# Patient Record
Sex: Male | Born: 1985 | Race: Black or African American | Hispanic: No | Smoking: Current every day smoker
Health system: Southern US, Community
[De-identification: ages and names within clinical notes are randomized; demographics above are authoritative.]

## PROBLEM LIST (undated history)

## (undated) ENCOUNTER — Ambulatory Visit: Source: Home / Self Care

## (undated) DIAGNOSIS — Z789 Other specified health status: Secondary | ICD-10-CM

## (undated) HISTORY — PX: NO PAST SURGERIES: SHX2092

---

## 2006-06-18 ENCOUNTER — Emergency Department: Payer: Self-pay | Admitting: Emergency Medicine

## 2006-11-04 ENCOUNTER — Emergency Department: Payer: Self-pay | Admitting: Emergency Medicine

## 2008-09-30 ENCOUNTER — Emergency Department: Payer: Self-pay

## 2011-11-13 ENCOUNTER — Emergency Department: Payer: Self-pay | Admitting: Unknown Physician Specialty

## 2016-02-16 DIAGNOSIS — S62308A Unspecified fracture of other metacarpal bone, initial encounter for closed fracture: Secondary | ICD-10-CM

## 2016-02-16 HISTORY — DX: Unspecified fracture of other metacarpal bone, initial encounter for closed fracture: S62.308A

## 2016-03-09 ENCOUNTER — Emergency Department
Admission: EM | Admit: 2016-03-09 | Discharge: 2016-03-09 | Disposition: A | Discharge status: Home / Self Care | Payer: BLUE CROSS/BLUE SHIELD | Attending: Emergency Medicine | Admitting: Emergency Medicine

## 2016-03-09 ENCOUNTER — Encounter: Payer: Self-pay | Admitting: *Deleted

## 2016-03-09 ENCOUNTER — Emergency Department: Payer: BLUE CROSS/BLUE SHIELD

## 2016-03-09 DIAGNOSIS — F17210 Nicotine dependence, cigarettes, uncomplicated: Secondary | ICD-10-CM | POA: Diagnosis not present

## 2016-03-09 DIAGNOSIS — Y9389 Activity, other specified: Secondary | ICD-10-CM | POA: Diagnosis not present

## 2016-03-09 DIAGNOSIS — Y9289 Other specified places as the place of occurrence of the external cause: Secondary | ICD-10-CM | POA: Insufficient documentation

## 2016-03-09 DIAGNOSIS — W208XXA Other cause of strike by thrown, projected or falling object, initial encounter: Secondary | ICD-10-CM | POA: Diagnosis not present

## 2016-03-09 DIAGNOSIS — S62309A Unspecified fracture of unspecified metacarpal bone, initial encounter for closed fracture: Secondary | ICD-10-CM

## 2016-03-09 DIAGNOSIS — S6991XA Unspecified injury of right wrist, hand and finger(s), initial encounter: Secondary | ICD-10-CM | POA: Diagnosis present

## 2016-03-09 DIAGNOSIS — Y999 Unspecified external cause status: Secondary | ICD-10-CM | POA: Insufficient documentation

## 2016-03-09 DIAGNOSIS — S62394A Other fracture of fourth metacarpal bone, right hand, initial encounter for closed fracture: Secondary | ICD-10-CM | POA: Insufficient documentation

## 2016-03-09 MED ORDER — OXYCODONE-ACETAMINOPHEN 5-325 MG PO TABS
2.0000 | ORAL_TABLET | Freq: Once | ORAL | Status: AC
  Administered 2016-03-09: 2 via ORAL
  Filled 2016-03-09: qty 2

## 2016-03-09 MED ORDER — OXYCODONE-ACETAMINOPHEN 7.5-325 MG PO TABS: 1.0000 | ORAL_TABLET | ORAL | 0 refills | Status: DC | PRN

## 2016-03-09 MED ORDER — IBUPROFEN 600 MG PO TABS: 600.0000 mg | ORAL_TABLET | Freq: Three times a day (TID) | ORAL | 0 refills | Status: DC | PRN

## 2016-03-09 NOTE — Discharge Instructions (Signed)
Wears splint and sling until evaluation by orthopedic Dr.

## 2016-03-09 NOTE — ED Triage Notes (Signed)
Pt was working on a motor, motor fell on right hand, right hand swollen and painful

## 2016-03-09 NOTE — ED Provider Notes (Signed)
[facility] Emergency Department Provider Note   ____________________________________________   First MD Initiated Contact with Patient 03/09/16 1516     (approximate)  I have reviewed the triage vital signs and the nursing notes.   HISTORY  Chief Complaint Hand Injury    HPI Cody Mccarty is a 30 y.o. male patient complaining of pain and edema to the right hand secondary to blunt trauma. Patient say a call motor fell on his hand while he was working. Patient denies loss of sensation but state increased pain with flexion of his fingers. Patient is left-hand dominant. Patient is rating his pain as a 10 over 10. Patient described a pain as sharp.   History reviewed. No pertinent past medical history.  There are no active problems to display for this patient.   History reviewed. No pertinent surgical history.  Prior to Admission medications   Medication Sig Start Date End Date Taking? Authorizing Provider  ibuprofen (ADVIL,MOTRIN) 600 MG tablet Take 1 tablet (600 mg total) by mouth every 8 (eight) hours as needed. 03/09/16   Joni Reiningonald K Dyasia Firestine, PA-C  oxyCODONE-acetaminophen (PERCOCET) 7.5-325 MG tablet Take 1 tablet by mouth every 4 (four) hours as needed for severe pain. 03/09/16   Joni Reiningonald K Rhilynn Preyer, PA-C    Allergies Review of patient's allergies indicates no known allergies.  No family history on file.  Social History Social History  Substance Use Topics  . Smoking status: Current Every Day Smoker    Packs/day: 0.50    Types: Cigarettes  . Smokeless tobacco: Not on file  . Alcohol use No    Review of Systems Constitutional: No fever/chills Eyes: No visual changes. ENT: No sore throat. Cardiovascular: Denies chest pain. Respiratory: Denies shortness of breath. Gastrointestinal: No abdominal pain.  No nausea, no vomiting.  No diarrhea.  No constipation. Genitourinary: Negative for dysuria. Musculoskeletal: Right hand pain  Skin: Negative  for rash. Neurological: Negative for headaches, focal weakness or numbness.    ____________________________________________   PHYSICAL EXAM:  VITAL SIGNS: ED Triage Vitals  Enc Vitals Group     BP 03/09/16 1406 128/78     Pulse Rate 03/09/16 1406 78     Resp 03/09/16 1406 18     Temp 03/09/16 1406 98 F (36.7 C)     Temp Source 03/09/16 1406 Oral     SpO2 03/09/16 1406 97 %     Weight 03/09/16 1407 160 lb (72.6 kg)     Height 03/09/16 1407 5\' 10"  (1.778 m)     Head Circumference --      Peak Flow --      Pain Score 03/09/16 1407 10     Pain Loc --      Pain Edu? --      Excl. in GC? --     Constitutional: Alert and oriented. Well appearing and in no acute distress. Eyes: Conjunctivae are normal. PERRL. EOMI. Head: Atraumatic. Nose: No congestion/rhinnorhea. Mouth/Throat: Mucous membranes are moist.  Oropharynx non-erythematous. Neck: No stridor.  No cervical spine tenderness to palpation Hematological/Lymphatic/Immunilogical: No cervical lymphadenopathy. Cardiovascular: Normal rate, regular rhythm. Grossly normal heart sounds.  Good peripheral circulation. Respiratory: Normal respiratory effort.  No retractions. Lungs CTAB. Gastrointestinal: Soft and nontender. No distention. No abdominal bruits. No CVA tenderness. Musculoskeletal: No lower extremity tenderness nor edema.  No joint effusions. Neurologic:  Normal speech and language. No gross focal neurologic deficits are appreciated. No gait instability. Skin:  Skin is warm, dry and intact. No rash  noted. Psychiatric: Mood and affect are normal. Speech and behavior are normal.  ____________________________________________   LABS (all labs ordered are listed, but only abnormal results are displayed)  Labs Reviewed - No data to display ____________________________________________  EKG   ____________________________________________  RADIOLOGY  X-rays reveal a Exelon the proximal aspect of the fourth metacarpal  with a dislocation at the hamate and fourth metacarpal junction. ____________________________________________   PROCEDURES  Procedure(s) performed: None  Procedures  Critical Care performed: No  ____________________________________________   INITIAL IMPRESSION / ASSESSMENT AND PLAN / ED COURSE  Pertinent labs & imaging results that were available during my care of the patient were reviewed by me and considered in my medical decision making (see chart for details).  Right hand fracture. Discussed x-ray finding with patient. Patient placed in a volar splint and sling. Patient given a prescription for Percocets and ibuprofen. Patient advised to contact the orthopedic department in 2 days to schedule follow-up appointment.  Clinical Course     ____________________________________________   FINAL CLINICAL IMPRESSION(S) / ED DIAGNOSES  Final diagnoses:  Metacarpal bone fracture, closed, initial encounter      NEW MEDICATIONS STARTED DURING THIS VISIT:  New Prescriptions   IBUPROFEN (ADVIL,MOTRIN) 600 MG TABLET    Take 1 tablet (600 mg total) by mouth every 8 (eight) hours as needed.   OXYCODONE-ACETAMINOPHEN (PERCOCET) 7.5-325 MG TABLET    Take 1 tablet by mouth every 4 (four) hours as needed for severe pain.     Note:  This document was prepared using Dragon voice recognition software and may include unintentional dictation errors.    Joni Reining, PA-C 03/09/16 1612    Arnaldo Natal, MD 03/09/16 [phone]

## 2016-03-13 ENCOUNTER — Encounter
Admission: RE | Admit: 2016-03-13 | Discharge: 2016-03-13 | Disposition: A | Discharge status: Home / Self Care | Payer: BLUE CROSS/BLUE SHIELD | Source: Ambulatory Visit | Attending: Orthopedic Surgery | Admitting: Orthopedic Surgery

## 2016-03-13 HISTORY — DX: Other specified health status: Z78.9

## 2016-03-14 ENCOUNTER — Encounter: Payer: Self-pay | Admitting: *Deleted

## 2016-03-14 ENCOUNTER — Ambulatory Visit
Admission: RE | Admit: 2016-03-14 | Discharge: 2016-03-14 | Disposition: A | Discharge status: Home / Self Care | Payer: BLUE CROSS/BLUE SHIELD | Source: Ambulatory Visit | Attending: Orthopedic Surgery | Admitting: Orthopedic Surgery

## 2016-03-14 ENCOUNTER — Ambulatory Visit: Payer: BLUE CROSS/BLUE SHIELD | Admitting: Anesthesiology

## 2016-03-14 ENCOUNTER — Ambulatory Visit: Payer: BLUE CROSS/BLUE SHIELD

## 2016-03-14 ENCOUNTER — Encounter
Admission: RE | Disposition: A | Discharge status: Home / Self Care | Payer: Self-pay | Source: Ambulatory Visit | Attending: Orthopedic Surgery

## 2016-03-14 DIAGNOSIS — W208XXA Other cause of strike by thrown, projected or falling object, initial encounter: Secondary | ICD-10-CM | POA: Insufficient documentation

## 2016-03-14 DIAGNOSIS — F17200 Nicotine dependence, unspecified, uncomplicated: Secondary | ICD-10-CM | POA: Diagnosis not present

## 2016-03-14 DIAGNOSIS — G8918 Other acute postprocedural pain: Secondary | ICD-10-CM

## 2016-03-14 DIAGNOSIS — S62314A Displaced fracture of base of fourth metacarpal bone, right hand, initial encounter for closed fracture: Secondary | ICD-10-CM | POA: Diagnosis present

## 2016-03-14 DIAGNOSIS — Y9389 Activity, other specified: Secondary | ICD-10-CM | POA: Insufficient documentation

## 2016-03-14 HISTORY — PX: CLOSED REDUCTION FINGER WITH PERCUTANEOUS PINNING: SHX5612

## 2016-03-14 HISTORY — PX: OPEN REDUCTION INTERNAL FIXATION (ORIF) METACARPAL: SHX6234

## 2016-03-14 SURGERY — CLOSED REDUCTION, FINGER, WITH PERCUTANEOUS PINNING
Anesthesia: General | Laterality: Right

## 2016-03-14 MED ORDER — FAMOTIDINE 20 MG PO TABS
20.0000 mg | ORAL_TABLET | Freq: Once | ORAL | Status: AC
  Administered 2016-03-14: 20 mg via ORAL

## 2016-03-14 MED ORDER — FAMOTIDINE 20 MG PO TABS
ORAL_TABLET | ORAL | Status: AC
  Administered 2016-03-14: 20 mg via ORAL
  Filled 2016-03-14: qty 1

## 2016-03-14 MED ORDER — FENTANYL CITRATE (PF) 100 MCG/2ML IJ SOLN
25.0000 ug | INTRAMUSCULAR | Status: AC | PRN
  Administered 2016-03-14 (×6): 25 ug via INTRAVENOUS

## 2016-03-14 MED ORDER — METOCLOPRAMIDE HCL 10 MG PO TABS: 5.0000 mg | ORAL_TABLET | Freq: Three times a day (TID) | ORAL | Status: DC | PRN

## 2016-03-14 MED ORDER — DEXAMETHASONE SODIUM PHOSPHATE 10 MG/ML IJ SOLN
INTRAMUSCULAR | Status: DC | PRN
  Administered 2016-03-14: 10 mg via INTRAVENOUS

## 2016-03-14 MED ORDER — FENTANYL CITRATE (PF) 100 MCG/2ML IJ SOLN
INTRAMUSCULAR | Status: AC
  Administered 2016-03-14: 25 ug via INTRAVENOUS
  Filled 2016-03-14: qty 2

## 2016-03-14 MED ORDER — FENTANYL CITRATE (PF) 100 MCG/2ML IJ SOLN
INTRAMUSCULAR | Status: AC
  Filled 2016-03-14: qty 2

## 2016-03-14 MED ORDER — ONDANSETRON HCL 4 MG/2ML IJ SOLN
INTRAMUSCULAR | Status: DC | PRN
  Administered 2016-03-14: 4 mg via INTRAVENOUS

## 2016-03-14 MED ORDER — OXYCODONE-ACETAMINOPHEN 5-325 MG PO TABS
1.0000 | ORAL_TABLET | ORAL | Status: DC | PRN
  Administered 2016-03-14: 1 via ORAL

## 2016-03-14 MED ORDER — OXYCODONE-ACETAMINOPHEN 7.5-325 MG PO TABS: 1.0000 | ORAL_TABLET | ORAL | 0 refills | Status: DC | PRN

## 2016-03-14 MED ORDER — ONDANSETRON HCL 4 MG PO TABS: 4.0000 mg | ORAL_TABLET | Freq: Four times a day (QID) | ORAL | Status: DC | PRN

## 2016-03-14 MED ORDER — FENTANYL CITRATE (PF) 100 MCG/2ML IJ SOLN
INTRAMUSCULAR | Status: DC | PRN
  Administered 2016-03-14 (×2): 50 ug via INTRAVENOUS

## 2016-03-14 MED ORDER — CEFAZOLIN SODIUM-DEXTROSE 2-4 GM/100ML-% IV SOLN
INTRAVENOUS | Status: AC
  Filled 2016-03-14: qty 100

## 2016-03-14 MED ORDER — OXYCODONE-ACETAMINOPHEN 5-325 MG PO TABS
ORAL_TABLET | ORAL | Status: AC
  Administered 2016-03-14: 1 via ORAL
  Filled 2016-03-14: qty 1

## 2016-03-14 MED ORDER — CEFAZOLIN SODIUM-DEXTROSE 2-4 GM/100ML-% IV SOLN
2.0000 g | Freq: Once | INTRAVENOUS | Status: AC
  Administered 2016-03-14: 2 g via INTRAVENOUS

## 2016-03-14 MED ORDER — LACTATED RINGERS IV SOLN
INTRAVENOUS | Status: DC
  Administered 2016-03-14: 12:00:00 via INTRAVENOUS

## 2016-03-14 MED ORDER — ONDANSETRON HCL 4 MG/2ML IJ SOLN: 4.0000 mg | Freq: Once | INTRAMUSCULAR | Status: DC | PRN

## 2016-03-14 MED ORDER — ONDANSETRON HCL 4 MG/2ML IJ SOLN: 4.0000 mg | Freq: Four times a day (QID) | INTRAMUSCULAR | Status: DC | PRN

## 2016-03-14 MED ORDER — SODIUM CHLORIDE 0.9 % IV SOLN: INTRAVENOUS | Status: DC

## 2016-03-14 MED ORDER — METOCLOPRAMIDE HCL 5 MG/ML IJ SOLN: 5.0000 mg | Freq: Three times a day (TID) | INTRAMUSCULAR | Status: DC | PRN

## 2016-03-14 SURGICAL SUPPLY — 41 items
BANDAGE ELASTIC 2 LF NS (GAUZE/BANDAGES/DRESSINGS) ×3 IMPLANT
BANDAGE ELASTIC 4 LF NS (GAUZE/BANDAGES/DRESSINGS) ×3 IMPLANT
BNDG COHESIVE 1X5 TAN NS LF (GAUZE/BANDAGES/DRESSINGS) ×3 IMPLANT
BNDG COHESIVE 4X5 TAN STRL (GAUZE/BANDAGES/DRESSINGS) IMPLANT
BNDG ESMARK 4X12 TAN STRL LF (GAUZE/BANDAGES/DRESSINGS) IMPLANT
BNDG GAUZE 1X2.1 STRL (MISCELLANEOUS) ×3 IMPLANT
CAST PADDING 2X4YD ST 30245 (MISCELLANEOUS) ×2
CHLORAPREP W/TINT 26ML (MISCELLANEOUS) ×3 IMPLANT
CUFF TOURN 18 STER (MISCELLANEOUS) IMPLANT
DRAPE FLUOR MINI C-ARM 54X84 (DRAPES) ×3 IMPLANT
ELECT CAUTERY BLADE 6.4 (BLADE) ×3 IMPLANT
ELECT REM PT RETURN 9FT ADLT (ELECTROSURGICAL)
ELECTRODE REM PT RTRN 9FT ADLT (ELECTROSURGICAL) IMPLANT
GAUZE PETRO XEROFOAM 1X8 (MISCELLANEOUS) ×3 IMPLANT
GAUZE SPONGE 4X4 12PLY STRL (GAUZE/BANDAGES/DRESSINGS) ×3 IMPLANT
GAUZE SPONGE NON-WVN 2X2 STRL (MISCELLANEOUS) ×1 IMPLANT
GLOVE BIOGEL PI IND STRL 9 (GLOVE) ×1 IMPLANT
GLOVE BIOGEL PI INDICATOR 9 (GLOVE) ×2
GLOVE SURG SYN 9.0  PF PI (GLOVE) ×2
GLOVE SURG SYN 9.0 PF PI (GLOVE) ×1 IMPLANT
GOWN SRG 2XL LVL 4 RGLN SLV (GOWNS) ×1 IMPLANT
GOWN STRL NON-REIN 2XL LVL4 (GOWNS) ×2
GOWN STRL REUS W/ TWL LRG LVL3 (GOWN DISPOSABLE) ×1 IMPLANT
GOWN STRL REUS W/TWL LRG LVL3 (GOWN DISPOSABLE) ×2
K-WIRE DBL TROCAR .045X4 ×3 IMPLANT
KIT RM TURNOVER STRD PROC AR (KITS) ×3 IMPLANT
KWIRE DBL TROCAR .045X4 ×1 IMPLANT
NEEDLE FILTER BLUNT 18X 1/2SAF (NEEDLE) ×2
NEEDLE FILTER BLUNT 18X1 1/2 (NEEDLE) ×1 IMPLANT
NEEDLE HYPO 25X1 1.5 SAFETY (NEEDLE) IMPLANT
NS IRRIG 500ML POUR BTL (IV SOLUTION) IMPLANT
PACK EXTREMITY ARMC (MISCELLANEOUS) ×3 IMPLANT
PAD PREP 24X41 OB/GYN DISP (PERSONAL CARE ITEMS) IMPLANT
PADDING CAST BLEND 4X4 NS (MISCELLANEOUS) ×3 IMPLANT
PADDING CAST COTTON 2X4 ST (MISCELLANEOUS) ×1 IMPLANT
SPONGE VERSALON 2X2 STRL (MISCELLANEOUS) ×2
STOCKINETTE STRL 4IN 9604848 (GAUZE/BANDAGES/DRESSINGS) ×3 IMPLANT
SUT ETHIBOND 4-0 (SUTURE) IMPLANT
SUT ETHILON 5 0 CL P 3 (SUTURE) IMPLANT
SUT ETHILON 5-0 FS-2 18 BLK (SUTURE) IMPLANT
SYRINGE 10CC LL (SYRINGE) IMPLANT

## 2016-03-14 NOTE — H&P (Signed)
Reviewed paper H+P, will be scanned into chart. No changes noted.  

## 2016-03-14 NOTE — Anesthesia Preprocedure Evaluation (Signed)
Anesthesia Evaluation  Patient identified by MRN, date of birth, ID band Patient awake    Reviewed: Allergy & Precautions, NPO status , Patient's Chart, lab work & pertinent test results  Airway Mallampati: III       Dental   Pulmonary Current Smoker,           Cardiovascular negative cardio ROS       Neuro/Psych negative neurological ROS     GI/Hepatic negative GI ROS, Neg liver ROS,   Endo/Other  negative endocrine ROS  Renal/GU negative Renal ROS  negative genitourinary   Musculoskeletal negative musculoskeletal ROS (+)   Abdominal   Peds negative pediatric ROS (+)  Hematology negative hematology ROS (+)   Anesthesia Other Findings   Reproductive/Obstetrics                             Anesthesia Physical Anesthesia Plan  ASA: I  Anesthesia Plan: General   Post-op Pain Management:    Induction: Intravenous  Airway Management Planned: LMA  Additional Equipment:   Intra-op Plan:   Post-operative Plan:   Informed Consent: I have reviewed the patients History and Physical, chart, labs and discussed the procedure including the risks, benefits and alternatives for the proposed anesthesia with the patient or authorized representative who has indicated his/her understanding and acceptance.     Plan Discussed with:   Anesthesia Plan Comments:         Anesthesia Quick Evaluation

## 2016-03-14 NOTE — Transfer of Care (Signed)
Immediate Anesthesia Transfer of Care Note  Patient: Cody Mccarty  Procedure(s) Performed: Procedure(s): CLOSED REDUCTION FINGER WITH PERCUTANEOUS PINNING (Right) OPEN REDUCTION INTERNAL FIXATION (ORIF) METACARPAL (Right)  Patient Location: PACU  Anesthesia Type:General  Level of Consciousness: patient cooperative and lethargic  Airway & Oxygen Therapy: Patient Spontanous Breathing and Patient connected to face mask oxygen  Post-op Assessment: Report given to RN and Post -op Vital signs reviewed and stable  Post vital signs: Reviewed and stable  Last Vitals:  Vitals:   03/14/16 1055 03/14/16 1248  BP: 130/79 128/71  Pulse: 72 68  Resp: 16 (!) 22  Temp: 36.9 C 36.5 C    Last Pain:  Vitals:   03/14/16 1248  TempSrc: Temporal         Complications: No apparent anesthesia complications

## 2016-03-14 NOTE — Discharge Instructions (Signed)
AMBULATORY SURGERY  DISCHARGE INSTRUCTIONS   1) The drugs that you were given will stay in your system until tomorrow so for the next 24 hours you should not:  A) Drive an automobile B) Make any legal decisions C) Drink any alcoholic beverage   2) You may resume regular meals tomorrow.  Today it is better to start with liquids and gradually work up to solid foods.  You may eat anything you prefer, but it is better to start with liquids, then soup and crackers, and gradually work up to solid foods.   3) Please notify your doctor immediately if you have any unusual bleeding, trouble breathing, redness and pain at the surgery site, drainage, fever, or pain not relieved by medication. 4)   5) Your post-operative visit with Dr.                                     is: Date:                        Time:    Please call to schedule your post-operative visit.  6) Additional Instructions:   Keep arm elevated. Work fingers as tolerated. Keep splint dry and intact

## 2016-03-14 NOTE — Anesthesia Postprocedure Evaluation (Signed)
Anesthesia Post Note  Patient: Cody Mccarty  Procedure(s) Performed: Procedure(s) (LRB): CLOSED REDUCTION FINGER WITH PERCUTANEOUS PINNING (Right) OPEN REDUCTION INTERNAL FIXATION (ORIF) METACARPAL (Right)  Patient location during evaluation: PACU Anesthesia Type: General Level of consciousness: awake and alert Pain management: pain level controlled Vital Signs Assessment: post-procedure vital signs reviewed and stable Respiratory status: spontaneous breathing and respiratory function stable Cardiovascular status: stable Anesthetic complications: no    Last Vitals:  Vitals:   03/14/16 1329 03/14/16 1357  BP: 125/79 123/79  Pulse: 69 (!) 56  Resp: 18 16  Temp:      Last Pain:  Vitals:   03/14/16 1357  TempSrc:   PainSc: 3                  Alesha Jaffee K

## 2016-03-14 NOTE — Anesthesia Procedure Notes (Signed)
Procedure Name: LMA Insertion Date/Time: 03/14/2016 12:24 PM Performed by: Omer JackWEATHERLY, Cody Krohn Pre-anesthesia Checklist: Patient identified, Patient being monitored, Timeout performed, Emergency Drugs available and Suction available Patient Re-evaluated:Patient Re-evaluated prior to inductionOxygen Delivery Method: Circle system utilized Preoxygenation: Pre-oxygenation with 100% oxygen Intubation Type: IV induction Ventilation: Mask ventilation without difficulty LMA: LMA inserted LMA Size: 4.0 Tube type: Oral Number of attempts: 1 Placement Confirmation: positive ETCO2 and breath sounds checked- equal and bilateral Tube secured with: Tape Dental Injury: Teeth and Oropharynx as per pre-operative assessment

## 2016-03-14 NOTE — Op Note (Signed)
03/14/2016  12:47 PM  PATIENT:  Cody Mccarty  30 y.o. male  PRE-OPERATIVE DIAGNOSIS:  closed displaced 4th carpal metacarpal joint fracture dislocation right hand  POST-OPERATIVE DIAGNOSIS:  Same  PROCEDURE:  Close reduction pinning of fourth CMC fracture dislocation  SURGEON: Leitha SchullerMichael J Savahanna Almendariz, MD  ASSISTANTS: None  ANESTHESIA:   general  EBL:  No intake/output data recorded.  BLOOD ADMINISTERED:none  DRAINS: none   LOCAL MEDICATIONS USED:  None    SPECIMEN:  No Specimen  DISPOSITION OF SPECIMEN:  N/A  COUNTS:  YES  TOURNIQUET:    IMPLANTS: 0.45 K wire 1  DICTATION: .Dragon Dictation patient brought the operating room and after adequate general anesthesia was obtained the right arm was prepped and draped in sterile fashion. Appropriate patient education timeout procedure were completed. After bringing in the mini C-arm reduction was carried out by dorsal pressure to the base of the fourth metacarpal with palpable and visible reduction. C-arm showed anatomic reduction. A 45 K wires then sent through the base of the fourth metacarpal across to the capitate without entering the carpal tunnel. This pin was then cut short and bent over to prevent migration. Xeroform around the base of the pin 4 x 4's and a short arm splint were applied followed by an Ace wrap patient tolerated procedure well center comes stable condition  PLAN OF CARE: Discharge to home after PACU  PATIENT DISPOSITION:  PACU - hemodynamically stable.

## 2017-06-09 ENCOUNTER — Emergency Department
Admission: EM | Admit: 2017-06-09 | Discharge: 2017-06-09 | Disposition: A | Discharge status: Home / Self Care | Payer: BLUE CROSS/BLUE SHIELD | Attending: Emergency Medicine | Admitting: Emergency Medicine

## 2017-06-09 ENCOUNTER — Other Ambulatory Visit: Payer: Self-pay

## 2017-06-09 ENCOUNTER — Emergency Department: Payer: BLUE CROSS/BLUE SHIELD

## 2017-06-09 ENCOUNTER — Encounter: Payer: Self-pay | Admitting: Emergency Medicine

## 2017-06-09 DIAGNOSIS — R079 Chest pain, unspecified: Secondary | ICD-10-CM | POA: Diagnosis not present

## 2017-06-09 DIAGNOSIS — J069 Acute upper respiratory infection, unspecified: Secondary | ICD-10-CM

## 2017-06-09 DIAGNOSIS — B349 Viral infection, unspecified: Secondary | ICD-10-CM | POA: Insufficient documentation

## 2017-06-09 DIAGNOSIS — F17210 Nicotine dependence, cigarettes, uncomplicated: Secondary | ICD-10-CM | POA: Insufficient documentation

## 2017-06-09 DIAGNOSIS — B9789 Other viral agents as the cause of diseases classified elsewhere: Secondary | ICD-10-CM

## 2017-06-09 DIAGNOSIS — R05 Cough: Secondary | ICD-10-CM | POA: Diagnosis present

## 2017-06-09 LAB — BASIC METABOLIC PANEL
Anion gap: 6 (ref 5–15)
BUN: 16 mg/dL (ref 6–20)
CALCIUM: 8.8 mg/dL — AB (ref 8.9–10.3)
CO2: 26 mmol/L (ref 22–32)
Chloride: 109 mmol/L (ref 101–111)
Creatinine, Ser: 1.08 mg/dL (ref 0.61–1.24)
GFR calc Af Amer: >60 mL/min (ref >60)
GLUCOSE: 96 mg/dL (ref 65–99)
POTASSIUM: 3.9 mmol/L (ref 3.5–5.1)
SODIUM: 141 mmol/L (ref 135–145)

## 2017-06-09 LAB — CBC
HEMATOCRIT: 37.7 % — AB (ref 40.0–52.0)
Hemoglobin: 12.4 g/dL — ABNORMAL LOW (ref 13.0–18.0)
MCH: 29.2 pg (ref 26.0–34.0)
MCHC: 33 g/dL (ref 32.0–36.0)
MCV: 88.6 fL (ref 80.0–100.0)
Platelets: 231 10*3/uL (ref 150–440)
RBC: 4.25 MIL/uL — ABNORMAL LOW (ref 4.40–5.90)
RDW: 13.1 % (ref 11.5–14.5)
WBC: 8.2 10*3/uL (ref 3.8–10.6)

## 2017-06-09 LAB — TROPONIN I

## 2017-06-09 MED ORDER — PREDNISONE 20 MG PO TABS
60.0000 mg | ORAL_TABLET | Freq: Once | ORAL | Status: AC
  Administered 2017-06-09: 60 mg via ORAL
  Filled 2017-06-09: qty 3

## 2017-06-09 MED ORDER — ACETAMINOPHEN 500 MG PO TABS
1000.0000 mg | ORAL_TABLET | Freq: Once | ORAL | Status: AC
  Administered 2017-06-09: 1000 mg via ORAL
  Filled 2017-06-09: qty 2

## 2017-06-09 MED ORDER — PREDNISONE 20 MG PO TABS: 60.0000 mg | ORAL_TABLET | Freq: Every day | ORAL | 0 refills | Status: AC

## 2017-06-09 MED ORDER — ALBUTEROL SULFATE (2.5 MG/3ML) 0.083% IN NEBU
5.0000 mg | INHALATION_SOLUTION | Freq: Once | RESPIRATORY_TRACT | Status: AC
  Administered 2017-06-09: 5 mg via RESPIRATORY_TRACT
  Filled 2017-06-09: qty 6

## 2017-06-09 MED ORDER — ASPIRIN 81 MG PO CHEW
324.0000 mg | CHEWABLE_TABLET | Freq: Once | ORAL | Status: AC
  Administered 2017-06-09: 324 mg via ORAL
  Filled 2017-06-09: qty 4

## 2017-06-09 MED ORDER — ALBUTEROL SULFATE HFA 108 (90 BASE) MCG/ACT IN AERS: 2.0000 | INHALATION_SPRAY | Freq: Four times a day (QID) | RESPIRATORY_TRACT | 2 refills | Status: DC | PRN

## 2017-06-09 NOTE — ED Triage Notes (Signed)
Chest tightness x 4 days.  Also c/o cough, non productive x 4 days.

## 2017-06-09 NOTE — ED Notes (Signed)
First Nurse: pt ambulatory to stat desk with reports of chest tightness. NAD noted.

## 2017-06-09 NOTE — ED Notes (Signed)
Pt discharged to home.  Family member driving.  Discharge instructions reviewed.  Verbalized understanding.  No questions or concerns at this time.  Teach back verified.  Pt in NAD.  No items left in ED.   

## 2017-06-09 NOTE — Discharge Instructions (Signed)
You were seen for chest pain. Your workup today was reassuring. As I explained to you that does not mean that you do not have heart disease. You may need further evaluation to ensure you do not have a serious heart problem. Therefore it is imperative that you follow up with your doctor in 1-2 days for further evaluation.   At this time, I believe your pain is due to reactive airway disease in the setting of a virus. Use the albuterol inhaler, 2 puffs every 4-6 hours for shortness of breath or chest tightness and take the steroids as prescribed.   When should you call for help?  Call 911 if: You passed out (lost consciousness) or if you feel dizzy. You have difficulty breathing. You have symptoms of a heart attack. These may include: Chest pain or pressure, or a strange feeling in your chest. Indigestion. Sweating. Shortness of breath. Nausea or vomiting. Pain, pressure, or a strange feeling in your back, neck, jaw, or upper belly or in one or both shoulders or arms. Lightheadedness or sudden weakness. A fast or irregular heartbeat. After you call 911, the operator may tell you to chew 1 adult-strength or 2 to 4 low-dose aspirin. Wait for an ambulance. Do not try to drive yourself.   Call your doctor today if: You have any trouble breathing. Your chest pain gets worse. You are dizzy or lightheaded, or you feel like you may faint. You are not getting better as expected. You are having new or different chest pain  How can you care for yourself at home? Rest until you feel better. Take your medicine exactly as prescribed. Call your doctor if you think you are having a problem with your medicine. Do not drive after taking a prescription pain medicine.

## 2017-06-09 NOTE — ED Provider Notes (Signed)
[facility] Emergency Department Provider Note  ____________________________________________  Time seen: Approximately 3:31 PM  I have reviewed the triage vital signs and the nursing notes.   HISTORY  Chief Complaint No chief complaint on file.   HPI Cody Mccarty is a 31 y.o. male with h/o former smoker who presents for evaluation of CP. Patient reports that pain started 4 days ago when he woke up with it in the setting of dry cough and congestion. He describes it as a tightness located in the center of his chest, constant. The pain is unchanged with exertion. While in the waiting room he reports that the pain started to radiate to his L shoulder however that has resolved. He reports that he has been having episodes of mild shortness of breath when he lays down but the pain is unchanged. According to his wife patient snores very loud at night and she has noticed several episodes where he stops breathing and seems like he is choking in the middle of the night. Patient used to smoke half a pack a day for several years and stopped 2 months ago. He has no family history of heart attacks.No fever, chills, no leg pain or swelling,no hemoptysis, no recent travel immobilization, no exogenous hormones.  Past Medical History:  Diagnosis Date  . Medical history non-contributory     There are no active problems to display for this patient.   Past Surgical History:  Procedure Laterality Date  . CLOSED REDUCTION FINGER WITH PERCUTANEOUS PINNING Right 03/14/2016   Procedure: CLOSED REDUCTION FINGER WITH PERCUTANEOUS PINNING;  Surgeon: Kennedy BuckerMichael Menz, MD;  Location: [facility] ORS;  Service: Orthopedics;  Laterality: Right;  . NO PAST SURGERIES    . OPEN REDUCTION INTERNAL FIXATION (ORIF) METACARPAL Right 03/14/2016   Procedure: OPEN REDUCTION INTERNAL FIXATION (ORIF) METACARPAL;  Surgeon: Kennedy BuckerMichael Menz, MD;  Location: [facility] ORS;  Service: Orthopedics;  Laterality: Right;     Prior to Admission medications   Medication Sig Start Date End Date Taking? Authorizing Provider  albuterol (PROVENTIL HFA;VENTOLIN HFA) 108 (90 Base) MCG/ACT inhaler Inhale 2 puffs into the lungs every 6 (six) hours as needed for wheezing or shortness of breath. 06/09/17   Nita SickleVeronese, [place], MD  ibuprofen (ADVIL,MOTRIN) 600 MG tablet Take 1 tablet (600 mg total) by mouth every 8 (eight) hours as needed. 03/09/16   Joni ReiningSmith, Ronald K, PA-C  oxyCODONE-acetaminophen (PERCOCET) 7.5-325 MG tablet Take 1 tablet by mouth every 4 (four) hours as needed for severe pain. 03/09/16   Joni ReiningSmith, Ronald K, PA-C  oxyCODONE-acetaminophen (PERCOCET) 7.5-325 MG tablet Take 1 tablet by mouth every 4 (four) hours as needed for severe pain. 03/14/16   Kennedy BuckerMenz, Michael, MD  predniSONE (DELTASONE) 20 MG tablet Take 3 tablets (60 mg total) by mouth daily for 4 days. 06/09/17 06/13/17  Nita SickleVeronese, [place], MD    Allergies Patient has no known allergies.  No family history on file.  Social History Social History   Tobacco Use  . Smoking status: Current Every Day Smoker    Packs/day: 0.50    Types: Cigarettes  . Smokeless tobacco: Never Used  Substance Use Topics  . Alcohol use: Yes  . Drug use: No    Review of Systems  Constitutional: Negative for fever. Eyes: Negative for visual changes. ENT: Negative for sore throat. Neck: No neck pain  Cardiovascular: + chest tightness. Respiratory: + shortness of breath, cough, congestion Gastrointestinal: Negative for abdominal pain, vomiting or diarrhea. Genitourinary: Negative for dysuria. Musculoskeletal: Negative for back  pain. Skin: Negative for rash. Neurological: Negative for headaches, weakness or numbness. Psych: No SI or HI  ____________________________________________   PHYSICAL EXAM:  VITAL SIGNS: ED Triage Vitals  Enc Vitals Group     BP 06/09/17 1227 140/71     Pulse Rate 06/09/17 1227 77     Resp 06/09/17 1227 16     Temp 06/09/17 1227 98.3  F (36.8 C)     Temp Source 06/09/17 1227 Oral     SpO2 06/09/17 1227 99 %     Weight 06/09/17 1225 180 lb (81.6 kg)     Height 06/09/17 1225 5\' 3"  (1.6 m)     Head Circumference --      Peak Flow --      Pain Score 06/09/17 1225 5     Pain Loc --      Pain Edu? --      Excl. in GC? --     Constitutional: Alert and oriented. Well appearing and in no apparent distress. HEENT:      Head: Normocephalic and atraumatic.         Eyes: Conjunctivae are normal. Sclera is non-icteric.       Mouth/Throat: Mucous membranes are moist.       Neck: Supple with no signs of meningismus. Cardiovascular: Regular rate and rhythm. No murmurs, gallops, or rubs. 2+ symmetrical distal pulses are present in all extremities. No JVD. Respiratory: Normal respiratory effort. Lungs are clear to auscultation bilaterally. No wheezes, crackles, or rhonchi. Slightly decreased air movement. Gastrointestinal: Soft, non tender, and non distended with positive bowel sounds. No rebound or guarding. Genitourinary: No CVA tenderness. Musculoskeletal: Nontender with normal range of motion in all extremities. No edema, cyanosis, or erythema of extremities. Neurologic: Normal speech and language. Face is symmetric. Moving all extremities. No gross focal neurologic deficits are appreciated. Skin: Skin is warm, dry and intact. No rash noted. Psychiatric: Mood and affect are normal. Speech and behavior are normal.  ____________________________________________   LABS (all labs ordered are listed, but only abnormal results are displayed)  Labs Reviewed  BASIC METABOLIC PANEL - Abnormal; Notable for the following components:      Result Value   Calcium 8.8 (*)    All other components within normal limits  CBC - Abnormal; Notable for the following components:   RBC 4.25 (*)    Hemoglobin 12.4 (*)    HCT 37.7 (*)    All other components within normal limits  TROPONIN I  TROPONIN I    ____________________________________________  EKG  ED ECG REPORT I, Nita Sickle, the attending physician, personally viewed and interpreted this ECG.  Normal sinus rhythm, rate of 67, normal intervals, normal axis, no ST elevations or depressions. Normal EKG.   ____________________________________________  RADIOLOGY  CXR: Negative  ____________________________________________   PROCEDURES  Procedure(s) performed: None Procedures Critical Care performed:  None ____________________________________________   INITIAL IMPRESSION / ASSESSMENT AND PLAN / ED COURSE  31 y.o. male with h/o former smoker who presents for evaluation of chest tightness and mild SOB for 4 days in the setting of cough and congestion. Presentation concerning for possible COPD exacerbation with decreased air movement in the setting of URI symptoms. Per wife description patient seems to have OSA. Will refer to PCP for evaluation. EKG with no ischemic changes, first troponin negative. HEART score 1. PERC negative for PE. Will give ASA and albuterol.  Clinical Course as of Jun 10 1627  Mon Jun 09, 2017  1625 Patient reports  improvement of tightness after albuterol. Will start patient on prednisone and albuterol inhaler. Will dc with close f/u with PCP. Discuss standard return precautions with patient. Patient's 2nd troponin is negative.  [CV]    Clinical Course User Index [CV] Don PerkingVeronese, [place], MD     As part of my medical decision making, I reviewed the following data within the electronic MEDICAL RECORD NUMBER Nursing notes reviewed and incorporated, Labs reviewed , EKG interpreted , Radiograph reviewed , Notes from prior ED visits and  Controlled Substance Database    Pertinent labs & imaging results that were available during my care of the patient were reviewed by me and considered in my medical decision making (see chart for details).    ____________________________________________   FINAL  CLINICAL IMPRESSION(S) / ED DIAGNOSES  Final diagnoses:  Viral URI with cough  Chest pain, unspecified type      NEW MEDICATIONS STARTED DURING THIS VISIT:  ED Discharge Orders        Ordered    albuterol (PROVENTIL HFA;VENTOLIN HFA) 108 (90 Base) MCG/ACT inhaler  Every 6 hours PRN     06/09/17 1627    predniSONE (DELTASONE) 20 MG tablet  Daily     06/09/17 1627       Note:  This document was prepared using Dragon voice recognition software and may include unintentional dictation errors.    Nita SickleVeronese, [place], MD 06/09/17 [phone]

## 2018-05-28 ENCOUNTER — Emergency Department: Payer: BLUE CROSS/BLUE SHIELD

## 2018-05-28 ENCOUNTER — Emergency Department
Admission: EM | Admit: 2018-05-28 | Discharge: 2018-05-28 | Disposition: A | Discharge status: Home / Self Care | Payer: BLUE CROSS/BLUE SHIELD | Attending: Emergency Medicine | Admitting: Emergency Medicine

## 2018-05-28 ENCOUNTER — Other Ambulatory Visit: Payer: Self-pay

## 2018-05-28 ENCOUNTER — Encounter: Payer: Self-pay | Admitting: Emergency Medicine

## 2018-05-28 DIAGNOSIS — R079 Chest pain, unspecified: Secondary | ICD-10-CM | POA: Diagnosis present

## 2018-05-28 DIAGNOSIS — F17210 Nicotine dependence, cigarettes, uncomplicated: Secondary | ICD-10-CM | POA: Diagnosis not present

## 2018-05-28 DIAGNOSIS — R001 Bradycardia, unspecified: Secondary | ICD-10-CM | POA: Insufficient documentation

## 2018-05-28 LAB — BASIC METABOLIC PANEL
Anion gap: 6 (ref 5–15)
BUN: 14 mg/dL (ref 6–20)
CHLORIDE: 112 mmol/L — AB (ref 98–111)
CO2: 23 mmol/L (ref 22–32)
Calcium: 9 mg/dL (ref 8.9–10.3)
Creatinine, Ser: 1.05 mg/dL (ref 0.61–1.24)
GFR calc Af Amer: >60 mL/min (ref >60)
GFR calc non Af Amer: >60 mL/min (ref >60)
Glucose, Bld: 99 mg/dL (ref 70–99)
POTASSIUM: 3.8 mmol/L (ref 3.5–5.1)
Sodium: 141 mmol/L (ref 135–145)

## 2018-05-28 LAB — CBC
HCT: 35.9 % — ABNORMAL LOW (ref 39.0–52.0)
Hemoglobin: 11.6 g/dL — ABNORMAL LOW (ref 13.0–17.0)
MCH: 29.3 pg (ref 26.0–34.0)
MCHC: 32.3 g/dL (ref 30.0–36.0)
MCV: 90.7 fL (ref 80.0–100.0)
PLATELETS: 177 10*3/uL (ref 150–400)
RBC: 3.96 MIL/uL — AB (ref 4.22–5.81)
RDW: 12.7 % (ref 11.5–15.5)
WBC: 8.9 10*3/uL (ref 4.0–10.5)
nRBC: 0 % (ref 0.0–0.2)

## 2018-05-28 LAB — TROPONIN I: Troponin I: <0.03 ng/mL (ref <0.03)

## 2018-05-28 MED ORDER — IBUPROFEN 800 MG PO TABS: 800.0000 mg | ORAL_TABLET | Freq: Three times a day (TID) | ORAL | 0 refills | Status: DC | PRN

## 2018-05-28 NOTE — ED Triage Notes (Signed)
C/O chest tightness while at work today.  States pain started this morning at around 1100.  C/O left chest tightness, intermittently, radiating to left upper arm.  States he thought pain was related to stress.  And while at work, pain worsened with lifting boxes.    Patient states he works two jobs and also has a 3110 month old.  AAOx3.  Skin warm and dry. NAD

## 2018-05-28 NOTE — Discharge Instructions (Signed)
You may take Motrin, with food, every 8 hours as needed for pain.  If you need pain medication in between these doses, you may add Tylenol.  Please apply ice to your left shoulder for 15 minutes every 2 hours.  Please do not overexert your shoulder; do not carry anything greater than 20 pounds until you have been cleared by the orthopedist to do so.  Return to the emergency department if you develop severe pain, numbness tingling or weakness, or any other symptoms concerning to you.

## 2018-05-28 NOTE — ED Notes (Signed)
Report off to vanessa rn 

## 2018-05-28 NOTE — ED Provider Notes (Signed)
[facility] Emergency Department Provider Note  ____________________________________________  Time seen: Approximately 7:16 PM  I have reviewed the triage vital signs and the nursing notes.   HISTORY  Chief Complaint Chest Pain    HPI Cody Mccarty is a 32 y.o. male , Lhanded, presenting with chest tightness and left shoulder pain.  The patient reports that he was at work, carrying heavy objects, when he had an acute onset of left shoulder pain, associated with chest tightness.  Initially, he had several minutes of slight tingling in the left shoulder, which is completely resolved.  The patient reports that his pain is exacerbated by any movement of the left shoulder or palpation.  He has not had any shortness of breath, palpitations, lightheadedness or fainting.  FH: No family history of early CAD.  No family history of blood clots.   Past Medical History:  Diagnosis Date  . Medical history non-contributory     There are no active problems to display for this patient.   Past Surgical History:  Procedure Laterality Date  . CLOSED REDUCTION FINGER WITH PERCUTANEOUS PINNING Right 03/14/2016   Procedure: CLOSED REDUCTION FINGER WITH PERCUTANEOUS PINNING;  Surgeon: Kennedy BuckerMichael Menz, MD;  Location: [facility] ORS;  Service: Orthopedics;  Laterality: Right;  . NO PAST SURGERIES    . OPEN REDUCTION INTERNAL FIXATION (ORIF) METACARPAL Right 03/14/2016   Procedure: OPEN REDUCTION INTERNAL FIXATION (ORIF) METACARPAL;  Surgeon: Kennedy BuckerMichael Menz, MD;  Location: [facility] ORS;  Service: Orthopedics;  Laterality: Right;    Current Outpatient Rx  . Order #: [id number] Class: Print  . Order #: [id number] Class: Print  . Order #: [id number] Class: Print  . Order #: [id number] Class: Print    Allergies Patient has no known allergies.  No family history on file.  Social History Social History   Tobacco Use  . Smoking status: Current Every Day Smoker    Packs/day: 0.50    Types:  Cigarettes  . Smokeless tobacco: Never Used  Substance Use Topics  . Alcohol use: Yes  . Drug use: No    Review of Systems Constitutional: No fever/chills. No lightheadedness or syncope. No trauma.  + Strain. Eyes: No visual changes. ENT: No sore throat. No congestion or rhinorrhea. Cardiovascular: Positive chest [place]. Denies palpitations. Respiratory: Denies shortness of breath.  No cough. Gastrointestinal: No abdominal pain.  No nausea, no vomiting.  No diarrhea.  No constipation. Genitourinary: Negative for dysuria. Musculoskeletal: Negative for back pain.  Neck pain.  Positive left shoulder pain. Skin: Negative for rash. Neurological: Negative for headaches.  Brief episode of tingling in the left shoulder, now resolved.      ____________________________________________   PHYSICAL EXAM:  VITAL SIGNS: ED Triage Vitals  Enc Vitals Group     BP 05/28/18 1600 (!) 158/80     Pulse Rate 05/28/18 1600 75     Resp --      Temp 05/28/18 1600 97.8 F (36.6 C)     Temp src --      SpO2 05/28/18 1600 98 %     Weight 05/28/18 1558 190 lb (86.2 kg)     Height 05/28/18 1558 5\' 10"  (1.778 m)     Head Circumference --      Peak Flow --      Pain Score 05/28/18 1558 6     Pain Loc --      Pain Edu? --      Excl. in GC? --     Constitutional: Alert and oriented.  Answers  questions appropriately.  Patient states holding his arm and does have some discomfort with motion of the left arm. Eyes: Conjunctivae are normal.  EOMI. No scleral icterus. Head: Atraumatic. Nose: No congestion/rhinnorhea. Mouth/Throat: Mucous membranes are moist.  Neck: No stridor.  Supple.  No JVD.  No meningismus. Cardiovascular: Normal rate, regular rhythm. No murmurs, rubs or gallops.  Respiratory: Normal respiratory effort.  No accessory muscle use or retractions. Lungs CTAB.  No wheezes, rales or ronchi. Gastrointestinal: Soft, nontender and nondistended.  No guarding or rebound.  No peritoneal  signs. Musculoskeletal: Normal left radial pulse.  Full range of motion of the left wrist and elbow without pain.  Some decreased range of motion in the left shoulder due to pain.  The patient has tenderness to palpation at the [facility] joint.  No tenderness to palpation over the clavicle.  Normal sensation to light touch throughout the left upper extremity.  No LE edema. No ttp in the calves or palpable cords.  Negative Homan's sign. Neurologic:  A&Ox3.  Speech is clear.  Face and smile are symmetric.  EOMI.  Moves all extremities well. Skin:  Skin is warm, dry and intact. No rash noted. Psychiatric: Mood and affect are normal. Speech and behavior are normal.  Normal judgement  ____________________________________________   LABS (all labs ordered are listed, but only abnormal results are displayed)  Labs Reviewed  BASIC METABOLIC PANEL - Abnormal; Notable for the following components:      Result Value   Chloride 112 (*)    All other components within normal limits  CBC - Abnormal; Notable for the following components:   RBC 3.96 (*)    Hemoglobin 11.6 (*)    HCT 35.9 (*)    All other components within normal limits  TROPONIN I   ____________________________________________  EKG  ED ECG REPORT I, Anne-Caroline Sharma Covert, the attending physician, personally viewed and interpreted this ECG.   Date: 05/28/2018  EKG Time: 1601  Rate: 51  Rhythm: sinus bradycardia  Axis: normal  Intervals:none  ST&T Change: No STEMI.  Nonspecific T wave inversion in V1.  No evidence of hypertrophy, Brugada syndrome, or prolonged QTC.  ____________________________________________  RADIOLOGY  Dg Chest 2 View  Result Date: 05/28/2018 CLINICAL DATA:  Chest tightness EXAM: CHEST - 2 VIEW COMPARISON:  06/09/2017 FINDINGS: The heart size and mediastinal contours are within normal limits. Both lungs are clear. The visualized skeletal structures are unremarkable. IMPRESSION: No active cardiopulmonary disease.  Electronically Signed   By: Jasmine Pang M.D.   On: 05/28/2018 16:33    ____________________________________________   PROCEDURES  Procedure(s) performed: None  Procedures  Critical Care performed: No ____________________________________________   INITIAL IMPRESSION / ASSESSMENT AND PLAN / ED COURSE  Pertinent labs & imaging results that were available during my care of the patient were reviewed by me and considered in my medical decision making (see chart for details).  32 y.o. male, otherwise healthy, left-handed, presenting with left shoulder pain after strain at work.  Overall, the patient is mildly hypertensive but otherwise hemodynamically stable.  He is having pain, which could explain his blood pressure; we will recheck it.  Patient symptoms are most likely due to shoulder strain, and I would also consider rotator cuff injury.  There is no neurovascular compromise in the left upper extremity.  It is much less likely that the patient symptoms are due to ACS or MI.  His EKG does not show any ischemic changes, and his troponin is negative.  His EKG  also does not show any evidence of arrhythmia, prolonged QTC, prolonged goddess syndrome or hypertrophy.  At this time, I have talked to the patient about the use of over-the-counter pain medications, cryotherapy, shoulder rest, and follow-up with orthopedics.  Follow-up instructions as well as return precautions were discussed.  ____________________________________________  FINAL CLINICAL IMPRESSION(S) / ED DIAGNOSES  Final diagnoses:  Sinus bradycardia         NEW MEDICATIONS STARTED DURING THIS VISIT:  New Prescriptions   IBUPROFEN (ADVIL,MOTRIN) 800 MG TABLET    Take 1 tablet (800 mg total) by mouth every 8 (eight) hours as needed.      Rockne Menghini, MD 05/28/18 [phone]

## 2018-05-28 NOTE — ED Notes (Signed)
Pt reports left side chest pain radiating into left arm.  Sx began at 11am today and worsened this afternoon at work.  Pt also reports intermittent sob.  cig smoker  No cough.  No fever.  Pt alert  Speech clear.  Pt is in hallway bed.

## 2018-07-27 ENCOUNTER — Emergency Department
Admission: EM | Admit: 2018-07-27 | Discharge: 2018-07-27 | Disposition: A | Discharge status: Home / Self Care | Payer: BLUE CROSS/BLUE SHIELD | Attending: Emergency Medicine | Admitting: Emergency Medicine

## 2018-07-27 ENCOUNTER — Encounter: Payer: Self-pay | Admitting: Emergency Medicine

## 2018-07-27 ENCOUNTER — Other Ambulatory Visit: Payer: Self-pay

## 2018-07-27 DIAGNOSIS — Y939 Activity, unspecified: Secondary | ICD-10-CM | POA: Diagnosis not present

## 2018-07-27 DIAGNOSIS — Y929 Unspecified place or not applicable: Secondary | ICD-10-CM | POA: Insufficient documentation

## 2018-07-27 DIAGNOSIS — Y990 Civilian activity done for income or pay: Secondary | ICD-10-CM | POA: Insufficient documentation

## 2018-07-27 DIAGNOSIS — F17210 Nicotine dependence, cigarettes, uncomplicated: Secondary | ICD-10-CM | POA: Insufficient documentation

## 2018-07-27 DIAGNOSIS — W450XXA Nail entering through skin, initial encounter: Secondary | ICD-10-CM | POA: Insufficient documentation

## 2018-07-27 DIAGNOSIS — S60511A Abrasion of right hand, initial encounter: Secondary | ICD-10-CM | POA: Insufficient documentation

## 2018-07-27 DIAGNOSIS — T148XXA Other injury of unspecified body region, initial encounter: Secondary | ICD-10-CM

## 2018-07-27 DIAGNOSIS — Z23 Encounter for immunization: Secondary | ICD-10-CM | POA: Diagnosis not present

## 2018-07-27 MED ORDER — BACITRACIN-NEOMYCIN-POLYMYXIN 400-5-5000 EX OINT
TOPICAL_OINTMENT | Freq: Once | CUTANEOUS | Status: AC
  Administered 2018-07-27: 1 via TOPICAL
  Filled 2018-07-27: qty 1

## 2018-07-27 MED ORDER — TETANUS-DIPHTH-ACELL PERTUSSIS 5-2.5-18.5 LF-MCG/0.5 IM SUSP
0.5000 mL | Freq: Once | INTRAMUSCULAR | Status: AC
  Administered 2018-07-27: 0.5 mL via INTRAMUSCULAR
  Filled 2018-07-27: qty 0.5

## 2018-07-27 MED ORDER — BACITRACIN-NEOMYCIN-POLYMYXIN 400-5-5000 EX OINT: 1.0000 "application " | TOPICAL_OINTMENT | Freq: Two times a day (BID) | CUTANEOUS | 0 refills | Status: DC

## 2018-07-27 NOTE — ED Triage Notes (Signed)
FIRST NURSE NOTE-scratch to top of hand from moving pallet at work. Not worker comp.  NAD

## 2018-07-27 NOTE — ED Provider Notes (Signed)
[facility] Emergency Department Provider Note  ____________________________________________  Time seen: Approximately 6:22 PM  I have reviewed the triage vital signs and the nursing notes.   HISTORY  Chief Complaint Abrasion    HPI Cody Mccarty is a 33 y.o. male that presents to the emergency department for evaluation of scratch to right hand today.  Patient states that he cut himself on a rusty nail at work. Nail scratched himself and did not puncture. He was recommended to come to the emergency department for his tetanus shot.   Past Medical History:  Diagnosis Date  . Medical history non-contributory     There are no active problems to display for this patient.   Past Surgical History:  Procedure Laterality Date  . CLOSED REDUCTION FINGER WITH PERCUTANEOUS PINNING Right 03/14/2016   Procedure: CLOSED REDUCTION FINGER WITH PERCUTANEOUS PINNING;  Surgeon: Kennedy Bucker, MD;  Location: [facility] ORS;  Service: Orthopedics;  Laterality: Right;  . NO PAST SURGERIES    . OPEN REDUCTION INTERNAL FIXATION (ORIF) METACARPAL Right 03/14/2016   Procedure: OPEN REDUCTION INTERNAL FIXATION (ORIF) METACARPAL;  Surgeon: Kennedy Bucker, MD;  Location: [facility] ORS;  Service: Orthopedics;  Laterality: Right;    Prior to Admission medications   Medication Sig Start Date End Date Taking? Authorizing Provider  albuterol (PROVENTIL HFA;VENTOLIN HFA) 108 (90 Base) MCG/ACT inhaler Inhale 2 puffs into the lungs every 6 (six) hours as needed for wheezing or shortness of breath. 06/09/17   Nita Sickle, MD  ibuprofen (ADVIL,MOTRIN) 800 MG tablet Take 1 tablet (800 mg total) by mouth every 8 (eight) hours as needed. 05/28/18   Rockne Menghini, MD  neomycin-bacitracin-polymyxin (NEOSPORIN) ointment Apply 1 application topically every 12 (twelve) hours. 07/27/18   Enid Derry, PA-C  oxyCODONE-acetaminophen (PERCOCET) 7.5-325 MG tablet Take 1 tablet by mouth every 4 (four)  hours as needed for severe pain. 03/09/16   Joni Reining, PA-C  oxyCODONE-acetaminophen (PERCOCET) 7.5-325 MG tablet Take 1 tablet by mouth every 4 (four) hours as needed for severe pain. 03/14/16   Kennedy Bucker, MD    Allergies Patient has no known allergies.  History reviewed. No pertinent family history.  Social History Social History   Tobacco Use  . Smoking status: Current Every Day Smoker    Packs/day: 0.50    Types: Cigarettes  . Smokeless tobacco: Never Used  Substance Use Topics  . Alcohol use: Yes  . Drug use: No     Review of Systems  Gastrointestinal: No nausea, no vomiting.  Musculoskeletal: Negative for musculoskeletal pain. Skin: Negative for rash, lacerations, ecchymosis. 1/2 cm shallow abrasion to right hand.  Neurological: Negative for numbness or tingling   ____________________________________________   PHYSICAL EXAM:  VITAL SIGNS: ED Triage Vitals  Enc Vitals Group     BP 07/27/18 1759 129/75     Pulse Rate 07/27/18 1759 70     Resp 07/27/18 1759 14     Temp 07/27/18 1759 98.2 F (36.8 C)     Temp Source 07/27/18 1759 Oral     SpO2 07/27/18 1759 98 %     Weight 07/27/18 1757 195 lb (88.5 kg)     Height 07/27/18 1757 5\' 10"  (1.778 m)     Head Circumference --      Peak Flow --      Pain Score 07/27/18 1757 2     Pain Loc --      Pain Edu? --      Excl. in GC? --  Constitutional: Alert and oriented. Well appearing and in no acute distress. Eyes: Conjunctivae are normal. PERRL. EOMI. Head: Atraumatic. ENT:      Ears:      Nose: No congestion/rhinnorhea.      Mouth/Throat: Mucous membranes are moist.  Neck: No stridor.  Cardiovascular: Normal rate, regular rhythm.  Good peripheral circulation. Respiratory: Normal respiratory effort without tachypnea or retractions. Lungs CTAB. Good air entry to the bases with no decreased or absent breath sounds. Musculoskeletal: Full range of motion to all extremities. No gross deformities  appreciated. Neurologic:  Normal speech and language. No gross focal neurologic deficits are appreciated.  Skin:  Skin is warm, dry. 1/2 cm abrasion to right dorsal hand.  Psychiatric: Mood and affect are normal. Speech and behavior are normal. Patient exhibits appropriate insight and judgement.   ____________________________________________   LABS (all labs ordered are listed, but only abnormal results are displayed)  Labs Reviewed - No data to display ____________________________________________  EKG   ____________________________________________  RADIOLOGY   No results found.  ____________________________________________    PROCEDURES  Procedure(s) performed:    Procedures    Medications  Tdap (BOOSTRIX) injection 0.5 mL (0.5 mLs Intramuscular Given 07/27/18 1840)  neomycin-bacitracin-polymyxin (NEOSPORIN) ointment packet (1 application Topical Given 07/27/18 1836)     ____________________________________________   INITIAL IMPRESSION / ASSESSMENT AND PLAN / ED COURSE  Pertinent labs & imaging results that were available during my care of the patient were reviewed by me and considered in my medical decision making (see chart for details).  Review of the [place] CSRS was performed in accordance of the [facility] prior to dispensing any controlled drugs.    Patient's diagnosis is consistent with abrasion.  It was soaked with iodine and normal saline.  Neosporin was applied.  Tetanus shot was updated.  Patient will be discharged home with prescriptions for Neosporin. Patient is to follow up with primary care as directed. Patient is given ED precautions to return to the ED for any worsening or new symptoms.     ____________________________________________  FINAL CLINICAL IMPRESSION(S) / ED DIAGNOSES  Final diagnoses:  Abrasion      NEW MEDICATIONS STARTED DURING THIS VISIT:  ED Discharge Orders         Ordered    neomycin-bacitracin-polymyxin (NEOSPORIN)  ointment  Every 12 hours     07/27/18 1831              This chart was dictated using voice recognition software/Dragon. Despite best efforts to proofread, errors can occur which can change the meaning. Any change was purely unintentional.    Enid Derry, PA-C 07/27/18 2231    Don Perking, [place], MD 07/27/18 [phone]

## 2018-07-27 NOTE — ED Triage Notes (Addendum)
Here for abrasion to right hand from nail on pallet at work. Not workers comp. Bleeding controlled. NAD. Mild swelling.

## 2021-05-08 ENCOUNTER — Emergency Department: Payer: BC Managed Care – PPO

## 2021-05-08 ENCOUNTER — Other Ambulatory Visit: Payer: Self-pay

## 2021-05-08 ENCOUNTER — Encounter: Payer: Self-pay | Admitting: Emergency Medicine

## 2021-05-08 DIAGNOSIS — Y9389 Activity, other specified: Secondary | ICD-10-CM | POA: Insufficient documentation

## 2021-05-08 DIAGNOSIS — M25561 Pain in right knee: Secondary | ICD-10-CM | POA: Insufficient documentation

## 2021-05-08 DIAGNOSIS — Y92009 Unspecified place in unspecified non-institutional (private) residence as the place of occurrence of the external cause: Secondary | ICD-10-CM | POA: Diagnosis not present

## 2021-05-08 DIAGNOSIS — F17210 Nicotine dependence, cigarettes, uncomplicated: Secondary | ICD-10-CM | POA: Insufficient documentation

## 2021-05-08 DIAGNOSIS — W01198A Fall on same level from slipping, tripping and stumbling with subsequent striking against other object, initial encounter: Secondary | ICD-10-CM | POA: Diagnosis not present

## 2021-05-08 DIAGNOSIS — S8991XA Unspecified injury of right lower leg, initial encounter: Secondary | ICD-10-CM | POA: Diagnosis present

## 2021-05-08 NOTE — ED Triage Notes (Signed)
Pt to ED from home c/o right knee pain tonight.  States was moving furniture and slipped and fell directly onto knee on concrete floor.  States swelling to knee and painful to bend now.

## 2021-05-09 ENCOUNTER — Emergency Department
Admission: EM | Admit: 2021-05-09 | Discharge: 2021-05-09 | Disposition: A | Discharge status: Home / Self Care | Payer: BC Managed Care – PPO | Attending: Emergency Medicine | Admitting: Emergency Medicine

## 2021-05-09 DIAGNOSIS — M25461 Effusion, right knee: Secondary | ICD-10-CM

## 2021-05-09 DIAGNOSIS — M25561 Pain in right knee: Secondary | ICD-10-CM

## 2021-05-09 DIAGNOSIS — W19XXXA Unspecified fall, initial encounter: Secondary | ICD-10-CM

## 2021-05-09 MED ORDER — OXYCODONE-ACETAMINOPHEN 5-325 MG PO TABS: 1.0000 | ORAL_TABLET | ORAL | 0 refills | Status: DC | PRN

## 2021-05-09 MED ORDER — IBUPROFEN 800 MG PO TABS: 800.0000 mg | ORAL_TABLET | Freq: Three times a day (TID) | ORAL | 0 refills | Status: DC | PRN

## 2021-05-09 MED ORDER — KETOROLAC TROMETHAMINE 30 MG/ML IJ SOLN
30.0000 mg | Freq: Once | INTRAMUSCULAR | Status: AC
  Administered 2021-05-09: 30 mg via INTRAMUSCULAR
  Filled 2021-05-09: qty 1

## 2021-05-09 MED ORDER — OXYCODONE-ACETAMINOPHEN 5-325 MG PO TABS
1.0000 | ORAL_TABLET | Freq: Once | ORAL | Status: AC
  Administered 2021-05-09: 1 via ORAL
  Filled 2021-05-09: qty 1

## 2021-05-09 NOTE — Discharge Instructions (Addendum)
1.  You may take pain medicines as needed (Motrin/Percocet #15). 2.  Wear knee immobilizer and use crutches as needed. 3.  Elevate affected area and apply ice several times daily to reduce swelling. 4.  Return to the ER for worsening symptoms, increased swelling, persistent vomiting, difficulty breathing or other concerns.

## 2021-05-09 NOTE — ED Provider Notes (Signed)
[facility] Emergency Department Provider Note   ____________________________________________   Event Date/Time   First MD Initiated Contact with Patient 05/09/21 [phone]     (approximate)  I have reviewed the triage vital signs and the nursing notes.   HISTORY  Chief Complaint Knee Pain    HPI Cody Mccarty is a 35 y.o. male who presents to the ED from home with a chief complaint of right knee injury.  Patient was moving furniture, slipped and fell directly onto his right knee onto the concrete floor.  Presents with pain and swelling to right knee.  Unable to bend secondary to pain and swelling.  Voices no other complaints or injuries.      Past Medical History:  Diagnosis Date   Medical history non-contributory     There are no problems to display for this patient.   Past Surgical History:  Procedure Laterality Date   CLOSED REDUCTION FINGER WITH PERCUTANEOUS PINNING Right 03/14/2016   Procedure: CLOSED REDUCTION FINGER WITH PERCUTANEOUS PINNING;  Surgeon: Kennedy Bucker, MD;  Location: [facility] ORS;  Service: Orthopedics;  Laterality: Right;   NO PAST SURGERIES     OPEN REDUCTION INTERNAL FIXATION (ORIF) METACARPAL Right 03/14/2016   Procedure: OPEN REDUCTION INTERNAL FIXATION (ORIF) METACARPAL;  Surgeon: Kennedy Bucker, MD;  Location: [facility] ORS;  Service: Orthopedics;  Laterality: Right;    Prior to Admission medications   Medication Sig Start Date End Date Taking? Authorizing Provider  ibuprofen (ADVIL) 800 MG tablet Take 1 tablet (800 mg total) by mouth every 8 (eight) hours as needed for moderate pain. 05/09/21  Yes Irean Hong, MD  oxyCODONE-acetaminophen (PERCOCET/ROXICET) 5-325 MG tablet Take 1 tablet by mouth every 4 (four) hours as needed for severe pain. 05/09/21  Yes Irean Hong, MD  albuterol (PROVENTIL HFA;VENTOLIN HFA) 108 (90 Base) MCG/ACT inhaler Inhale 2 puffs into the lungs every 6 (six) hours as needed for wheezing or shortness of  breath. 06/09/17   Nita Sickle, MD  neomycin-bacitracin-polymyxin (NEOSPORIN) ointment Apply 1 application topically every 12 (twelve) hours. 07/27/18   Enid Derry, PA-C    Allergies Patient has no known allergies.  History reviewed. No pertinent family history.  Social History Social History   Tobacco Use   Smoking status: Every Day    Packs/day: 0.50    Types: Cigarettes   Smokeless tobacco: Never  Substance Use Topics   Alcohol use: Yes   Drug use: No    Review of Systems  Constitutional: No fever/chills Eyes: No visual changes. ENT: No sore throat. Cardiovascular: Denies chest pain. Respiratory: Denies shortness of breath. Gastrointestinal: No abdominal pain.  No nausea, no vomiting.  No diarrhea.  No constipation. Genitourinary: Negative for dysuria. Musculoskeletal: Positive for right knee injury.  Negative for back pain. Skin: Negative for rash. Neurological: Negative for headaches, focal weakness or numbness.   ____________________________________________   PHYSICAL EXAM:  VITAL SIGNS: ED Triage Vitals  Enc Vitals Group     BP 05/08/21 2236 133/79     Pulse Rate 05/08/21 2236 85     Resp 05/08/21 2236 16     Temp 05/08/21 2236 98.9 F (37.2 C)     Temp Source 05/08/21 2236 Oral     SpO2 05/08/21 2236 96 %     Weight 05/08/21 2236 175 lb (79.4 kg)     Height 05/08/21 2236 5\' 10"  (1.778 m)     Head Circumference --      Peak Flow --  Pain Score 05/08/21 2240 7     Pain Loc --      Pain Edu? --      Excl. in GC? --     Constitutional: Alert and oriented. Well appearing and in mild acute distress. Eyes: Conjunctivae are normal. PERRL. EOMI. Head: Atraumatic. Nose: No congestion/rhinnorhea. Mouth/Throat: Mucous membranes are moist.   Neck: No stridor.  No cervical spine tenderness to palpation. Cardiovascular: Normal rate, regular rhythm. Grossly normal heart sounds.  Good peripheral circulation. Respiratory: Normal respiratory  effort.  No retractions. Lungs CTAB. Gastrointestinal: Soft and nontender to light or deep palpation. No distention. No abdominal bruits. No CVA tenderness. Musculoskeletal: Right knee with mild to moderate swelling and effusion.  Limited range of motion secondary to pain.  2+ distal pulses.  Brisk, less than 5-second capillary refill.  No joint effusions. Neurologic:  Normal speech and language. No gross focal neurologic deficits are appreciated.  Skin:  Skin is warm, dry and intact. No rash noted. Psychiatric: Mood and affect are normal. Speech and behavior are normal.  ____________________________________________   LABS (all labs ordered are listed, but only abnormal results are displayed)  Labs Reviewed - No data to display ____________________________________________  EKG  None ____________________________________________  RADIOLOGY I, Kelina Beauchamp J, personally viewed and evaluated these images (plain radiographs) as part of my medical decision making, as well as reviewing the written report by the radiologist.  ED MD interpretation: No acute osseous injury  Official radiology report(s): DG Knee Complete 4 Views Right  Result Date: 05/08/2021 CLINICAL DATA:  Right knee pain following fall, initial encounter EXAM: RIGHT KNEE - COMPLETE 4+ VIEW COMPARISON:  None. FINDINGS: No evidence of fracture, dislocation, or joint effusion. No evidence of arthropathy or other focal bone abnormality. Soft tissues are unremarkable. IMPRESSION: No acute abnormality noted. Electronically Signed   By: Alcide Clever M.D.   On: 05/08/2021 23:08    ____________________________________________   PROCEDURES  Procedure(s) performed (including Critical Care):  Procedures   ____________________________________________   INITIAL IMPRESSION / ASSESSMENT AND PLAN / ED COURSE  As part of my medical decision making, I reviewed the following data within the electronic MEDICAL RECORD NUMBER Nursing notes  reviewed and incorporated, Notes from prior ED visits, and [place] Controlled Substance Database     35 year old male presenting with right knee injury; x-rays negative for acute fracture or dislocation.  Will administer IM Toradol, oral Percocet.  Placed in knee immobilizer and provide crutches.  Patient will follow up with orthopedics as needed.  Strict return precautions given.  Patient verbalizes understanding and agrees with plan of care.      ____________________________________________   FINAL CLINICAL IMPRESSION(S) / ED DIAGNOSES  Final diagnoses:  Fall, initial encounter  Acute pain of right knee  Effusion of right knee     ED Discharge Orders          Ordered    ibuprofen (ADVIL) 800 MG tablet  Every 8 hours PRN        05/09/21 0523    oxyCODONE-acetaminophen (PERCOCET/ROXICET) 5-325 MG tablet  Every 4 hours PRN        05/09/21 0523             Note:  This document was prepared using Dragon voice recognition software and may include unintentional dictation errors.    Irean Hong, MD 05/09/21 [phone]

## 2021-11-15 DIAGNOSIS — L03114 Cellulitis of left upper limb: Secondary | ICD-10-CM

## 2021-11-15 HISTORY — DX: Cellulitis of left upper limb: L03.114

## 2022-02-05 ENCOUNTER — Emergency Department
Admission: EM | Admit: 2022-02-05 | Discharge: 2022-02-05 | Disposition: A | Discharge status: Home / Self Care | Payer: BC Managed Care – PPO | Attending: Student in an Organized Health Care Education/Training Program | Admitting: Student in an Organized Health Care Education/Training Program

## 2022-02-05 ENCOUNTER — Emergency Department: Payer: BC Managed Care – PPO

## 2022-02-05 ENCOUNTER — Other Ambulatory Visit: Payer: Self-pay

## 2022-02-05 ENCOUNTER — Encounter: Payer: Self-pay | Admitting: Emergency Medicine

## 2022-02-05 DIAGNOSIS — S299XXA Unspecified injury of thorax, initial encounter: Secondary | ICD-10-CM | POA: Diagnosis present

## 2022-02-05 DIAGNOSIS — S46911A Strain of unspecified muscle, fascia and tendon at shoulder and upper arm level, right arm, initial encounter: Secondary | ICD-10-CM | POA: Insufficient documentation

## 2022-02-05 DIAGNOSIS — X503XXA Overexertion from repetitive movements, initial encounter: Secondary | ICD-10-CM | POA: Insufficient documentation

## 2022-02-05 DIAGNOSIS — T148XXA Other injury of unspecified body region, initial encounter: Secondary | ICD-10-CM

## 2022-02-05 DIAGNOSIS — S29012A Strain of muscle and tendon of back wall of thorax, initial encounter: Secondary | ICD-10-CM | POA: Insufficient documentation

## 2022-02-05 MED ORDER — CYCLOBENZAPRINE HCL 5 MG PO TABS: 5.0000 mg | ORAL_TABLET | Freq: Three times a day (TID) | ORAL | 0 refills | Status: DC | PRN

## 2022-02-05 MED ORDER — LIDOCAINE 5 % EX PTCH
1.0000 | MEDICATED_PATCH | CUTANEOUS | Status: DC
  Administered 2022-02-05: 1 via TRANSDERMAL
  Filled 2022-02-05: qty 1

## 2022-02-05 MED ORDER — KETOROLAC TROMETHAMINE 15 MG/ML IJ SOLN
15.0000 mg | Freq: Once | INTRAMUSCULAR | Status: AC
  Administered 2022-02-05: 15 mg via INTRAMUSCULAR
  Filled 2022-02-05: qty 1

## 2022-02-05 MED ORDER — CYCLOBENZAPRINE HCL 10 MG PO TABS
5.0000 mg | ORAL_TABLET | Freq: Once | ORAL | Status: AC
  Administered 2022-02-05: 5 mg via ORAL
  Filled 2022-02-05: qty 1

## 2022-02-05 MED ORDER — LIDOCAINE 5 % EX PTCH
1.0000 | MEDICATED_PATCH | Freq: Two times a day (BID) | CUTANEOUS | 0 refills | Status: AC
Start: 2022-02-05 — End: 2022-02-10

## 2022-02-05 NOTE — ED Notes (Signed)
See triage note  Presents with pain to right side of neck which moves into posterior right shoulder  Denies any fall but states he did move furniture this past week  Having increased when turning his head

## 2022-02-05 NOTE — ED Triage Notes (Signed)
C/O neck and upper back pain today.  States was moving some things yesterday, and today woke up with pain.  AAOx3.  Skin warm and dry. NAD

## 2022-02-05 NOTE — ED Provider Notes (Signed)
[facility] Provider Note    Event Date/Time   First MD Initiated Contact with Patient 02/05/22 [phone]     (approximate)   History   Back Pain   HPI  Cody Mccarty is a 36 y.o. male with no reported past medical history presents today for evaluation of right upper back and shoulder discomfort.  Patient reports that he was lifting heavy furniture including a refrigerator, a wooden dresser, and a couch and began to have pain in his right trapezius muscle area.  He reports that he has pain when palpating this area, when moving his neck.  He denies any numbness or weakness in his arms or legs.  He denies any urinary or fecal incontinence or retention.  He denies paresthesias in his arm or decreased grip strength.  He has not taken anything for his symptoms.  He has to go to work today and lift 60 pound items so he came to the emergency department first.  He denies fevers or chills or IV drug use.  There are no problems to display for this patient.         Physical Exam   Triage Vital Signs: ED Triage Vitals  Enc Vitals Group     BP 02/05/22 0931 117/72     Pulse Rate 02/05/22 0931 61     Resp 02/05/22 0931 18     Temp 02/05/22 0931 98 F (36.7 C)     Temp Source 02/05/22 0931 Oral     SpO2 02/05/22 0931 100 %     Weight 02/05/22 0919 175 lb 0.7 oz (79.4 kg)     Height 02/05/22 0919 5\' 10"  (1.778 m)     Head Circumference --      Peak Flow --      Pain Score 02/05/22 0919 8     Pain Loc --      Pain Edu? --      Excl. in [place]? --     Most recent vital signs: Vitals:   02/05/22 0931 02/05/22 1140  BP: 117/72 122/78  Pulse: 61 68  Resp: 18 17  Temp: 98 F (36.7 C) 98.4 F (36.9 C)  SpO2: 100% 99%    Physical Exam Vitals and nursing note reviewed.  Constitutional:      General: Awake and alert. No acute distress.    Appearance: Normal appearance. The patient is normal weight.  HENT:     Head: Normocephalic and atraumatic.     Mouth:  Mucous membranes are moist.  Eyes:     General: PERRL. Normal EOMs        Right eye: No discharge.        Left eye: No discharge.     Conjunctiva/sclera: Conjunctivae normal.  Cardiovascular:     Rate and Rhythm: Normal rate and regular rhythm.     Pulses: Normal pulses.     Heart sounds: Normal heart sounds Pulmonary:     Effort: Pulmonary effort is normal. No respiratory distress.     Breath sounds: Normal breath sounds.  Abdominal:     Abdomen is soft. There is no abdominal tenderness. No rebound or guarding.  Musculoskeletal:        General: No swelling. Normal range of motion.     Cervical back: Normal range of motion and neck supple.  No midline cervical spine tenderness.  Tenderness to palpation to right trapezius muscle area without overlying skin color changes.  Full range of motion of neck.  Negative Spurling test.  Negative Lhermitte sign.  Normal strength and sensation in bilateral upper extremities. Normal grip strength bilaterally.  Normal intrinsic muscle function of the hand bilaterally.  Normal radial pulses bilaterally. Skin:    General: Skin is warm and dry.     Capillary Refill: Capillary refill takes less than 2 seconds.     Findings: No rash.  Neurological:     Mental Status: The patient is awake and alert.      ED Results / Procedures / Treatments   Labs (all labs ordered are listed, but only abnormal results are displayed) Labs Reviewed - No data to display   EKG     RADIOLOGY     PROCEDURES:  Critical Care performed:   Procedures   MEDICATIONS ORDERED IN ED: Medications  lidocaine (LIDODERM) 5 % 1 patch (1 patch Transdermal Patch Applied 02/05/22 1007)  ketorolac (TORADOL) 15 MG/ML injection 15 mg (15 mg Intramuscular Given 02/05/22 1006)  cyclobenzaprine (FLEXERIL) tablet 5 mg (5 mg Oral Given 02/05/22 1006)     IMPRESSION / MDM / ASSESSMENT AND PLAN / ED COURSE  I reviewed the triage vital signs and the nursing  notes.   Differential diagnosis includes, but is not limited to, muscle spasm, cervical radiculopathy, osseous injury, muscle strain.  Patient is awake and alert, hemodynamically stable and neurovascularly intact.  He has normal strength and sensation of bilateral upper extremities, normal grip strength, no radicular type symptoms, not consistent with central cord syndrome or cervical radiculopathy.  He has tenderness along his trapezius muscle consistent with muscle spasm in the setting of heavy lifting.  He was treated symptomatically with Toradol, Flexeril, and a Lidoderm patch.  X-ray cervical spine is without any abnormal findings.  Upon reevaluation, patient reports that he feels significantly improved and has improved range of motion of his neck.  He is able to turn his head side to side more than 45 degrees.  He continues to have normal strength and sensation of bilateral upper extremities and no neurological deficits.  He was given the same medications to take at home, though advised that he cannot drive, operate heavy machinery, or perform tasks that require concentration while taking the Flexeril.  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  Discussed strict return precautions and the importance of close outpatient follow-up.  Patient understands and agrees with plan.  Discharged in stable condition.   Patient's presentation is most consistent with acute complicated illness / injury requiring diagnostic workup.   Clinical Course as of 02/05/22 1516  Tue Feb 05, 2022  1131 Patient has significantly improved ROM and pain [JP]    Clinical Course User Index [JP] Tiffiany Beadles, Herb Grays, PA-C     FINAL CLINICAL IMPRESSION(S) / ED DIAGNOSES   Final diagnoses:  Muscle strain     Rx / DC Orders   ED Discharge Orders          Ordered    cyclobenzaprine (FLEXERIL) 5 MG tablet  3 times daily PRN        02/05/22 1132    lidocaine (LIDODERM) 5 %   Every 12 hours        02/05/22 1132             Note:  This document was prepared using Dragon voice recognition software and may include unintentional dictation errors.   Keturah Shavers 02/05/22 1516    Willy Eddy, MD 02/05/22 1534

## 2022-02-05 NOTE — Discharge Instructions (Signed)
You may take the medications as prescribed.  Remember that the Flexeril will make you sleepy and you should not drive, operate heavy machinery, or perform any tests or require concentration while taking this medication.  Please refrain from heavy lifting until your symptoms improve.  Return if you develop weakness, numbness, worsening pain, headaches, dizziness, vision changes, or any other concerns.  It was a pleasure caring for you today.

## 2022-02-05 NOTE — ED Notes (Signed)
Pt gives verbal consent to DC 

## 2022-02-05 NOTE — ED Notes (Signed)
Patient to xray at this time

## 2022-02-21 IMAGING — CR DG KNEE COMPLETE 4+V*R*
1 series · 5 of 5 positions shown · non-contrast
Comparison: None.

CLINICAL DATA: Right knee pain following fall, initial encounter

EXAM:
RIGHT KNEE - COMPLETE 4+ VIEW

[Series 1: dg knee complete 4 views right · 0.14mm/px · 5 of 5 slices shown]
[im 1/5]
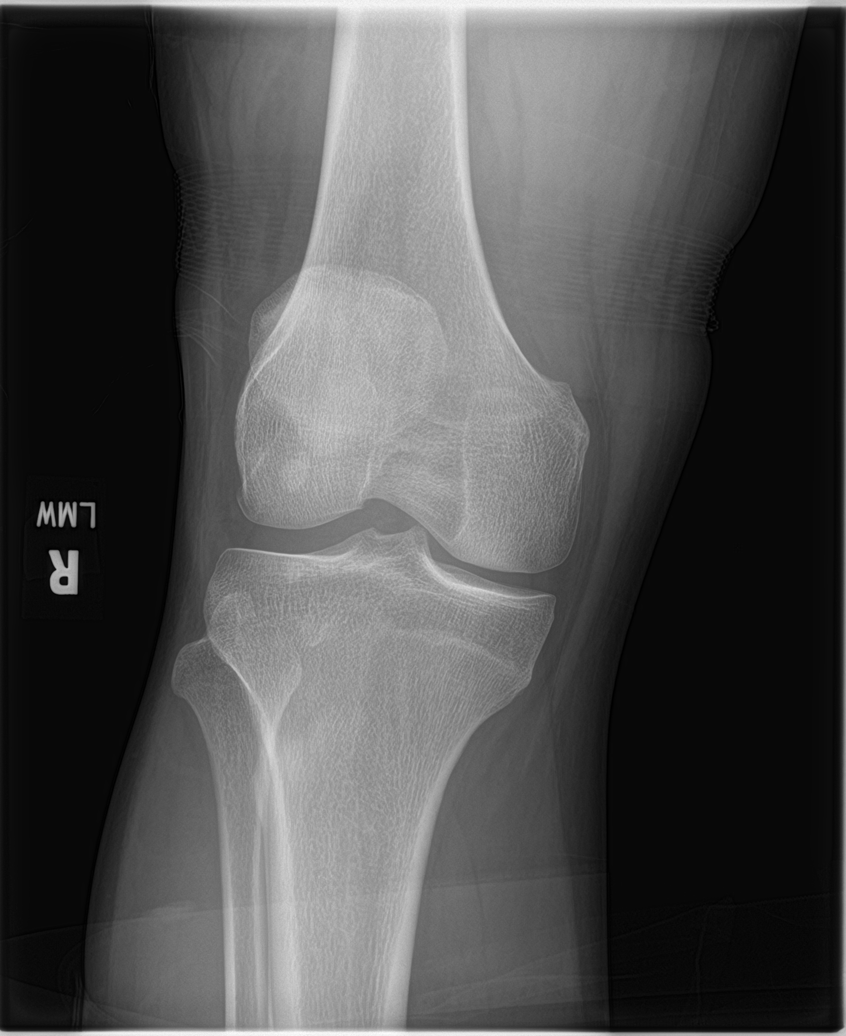
[im 2/5]
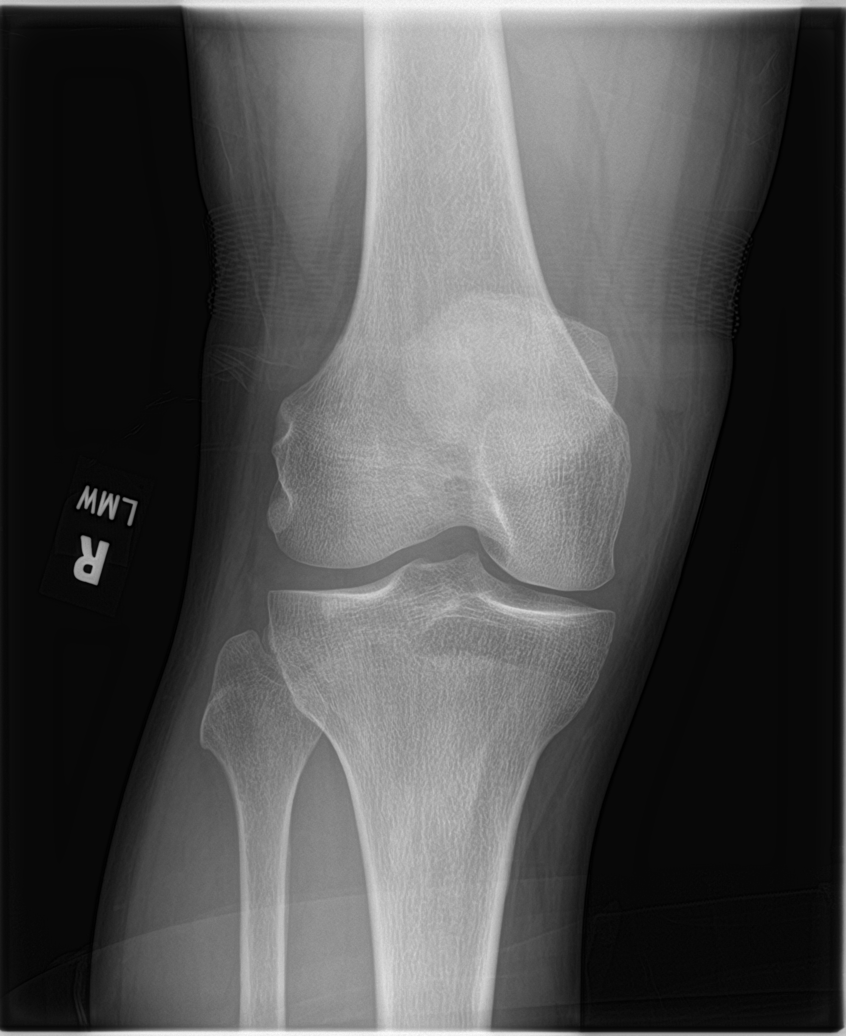
[im 3/5]
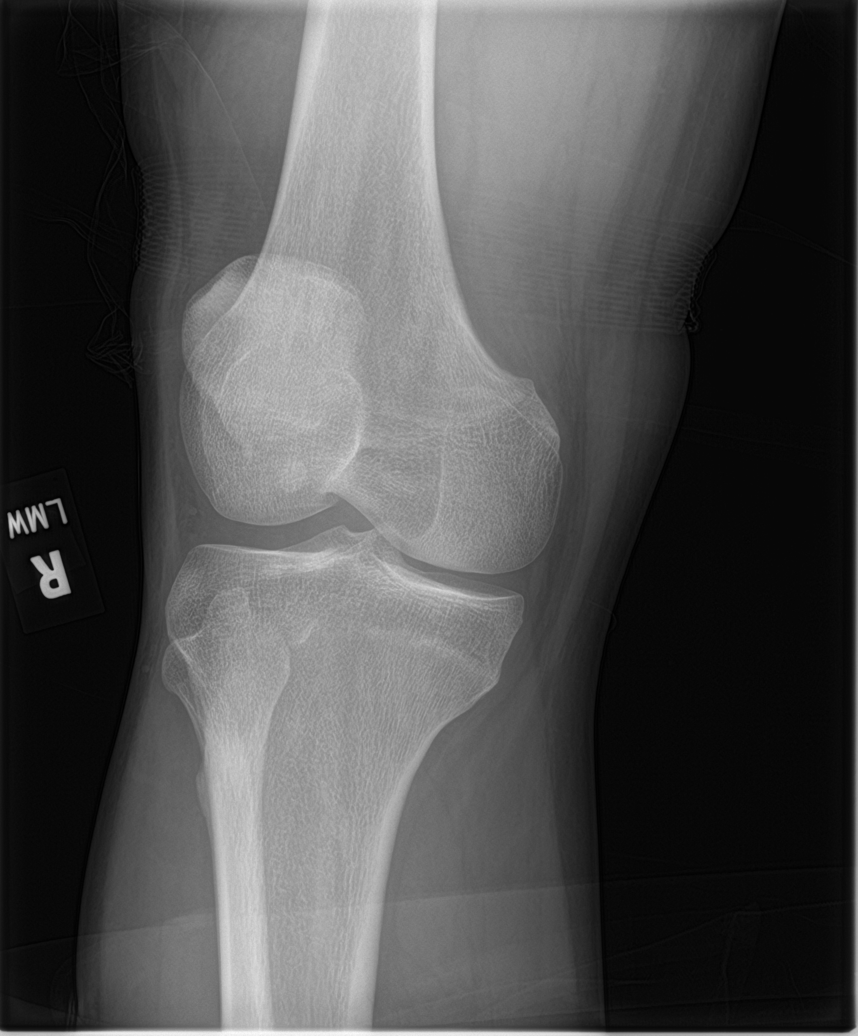
[im 4/5]
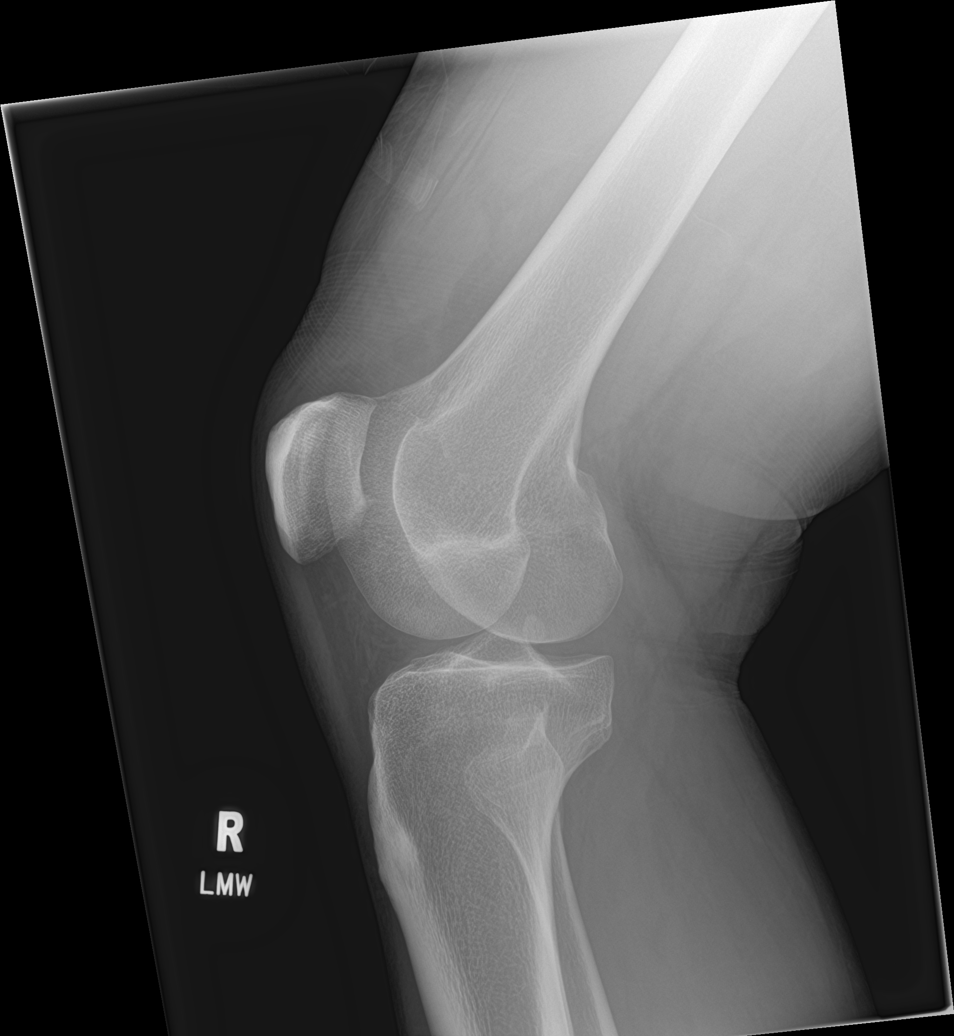
[im 5/5]
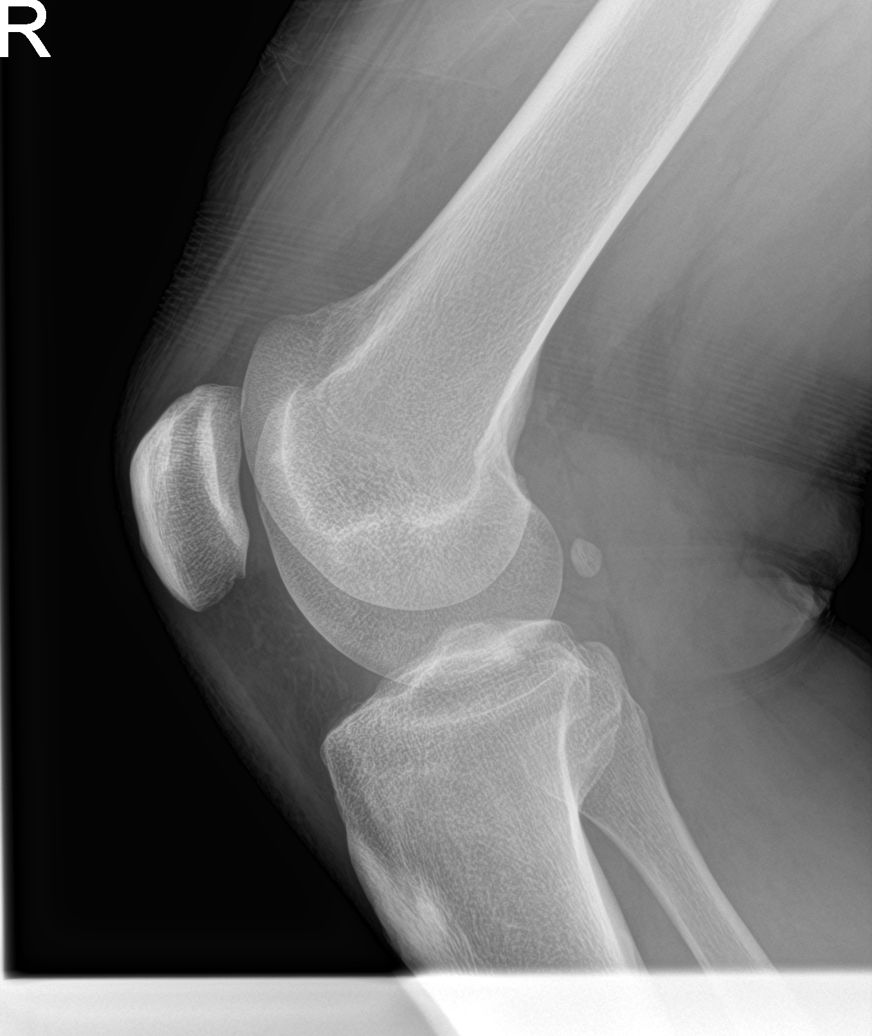

[5 of 5 positions shown; findings below may reference images not displayed]

FINDINGS: No evidence of fracture, dislocation, or joint effusion. No evidence
of arthropathy or other focal bone abnormality. Soft tissues are
unremarkable.
IMPRESSION: No acute abnormality noted.

## 2022-04-16 ENCOUNTER — Encounter: Payer: Self-pay | Admitting: Emergency Medicine

## 2022-04-16 ENCOUNTER — Emergency Department
Admission: EM | Admit: 2022-04-16 | Discharge: 2022-04-16 | Disposition: A | Discharge status: Home / Self Care | Payer: BC Managed Care – PPO | Attending: Emergency Medicine | Admitting: Emergency Medicine

## 2022-04-16 ENCOUNTER — Other Ambulatory Visit: Payer: Self-pay

## 2022-04-16 DIAGNOSIS — M545 Low back pain, unspecified: Secondary | ICD-10-CM | POA: Diagnosis present

## 2022-04-16 MED ORDER — METHOCARBAMOL 500 MG PO TABS
500.0000 mg | ORAL_TABLET | Freq: Four times a day (QID) | ORAL | 0 refills | Status: DC
Start: 2022-04-16 — End: 2022-12-17

## 2022-04-16 MED ORDER — NAPROXEN 500 MG PO TABS: 500.0000 mg | ORAL_TABLET | Freq: Two times a day (BID) | ORAL | 0 refills | Status: DC

## 2022-04-16 NOTE — ED Triage Notes (Signed)
Pt c/o back pain that started 2 days ago. Pt unsure if he injured back or not. No hx of chronic back pain.

## 2022-04-16 NOTE — ED Provider Notes (Signed)
[facility] Provider Note    Event Date/Time   First MD Initiated Contact with Patient 04/16/22 [phone]     (approximate)   History   Back Pain   HPI  Cody Mccarty is a 36 y.o. male presents to the emergency department for treatment and evaluation of back pain was present upon awakening this morning.  No known injury.  No alleviating measures attempted prior to arrival.     Physical Exam   Triage Vital Signs: ED Triage Vitals  Enc Vitals Group     BP 04/16/22 0731 123/69     Pulse Rate 04/16/22 0731 (!) 54     Resp 04/16/22 0731 18     Temp 04/16/22 0731 98.2 F (36.8 C)     Temp Source 04/16/22 0731 Oral     SpO2 04/16/22 0731 95 %     Weight 04/16/22 0727 185 lb (83.9 kg)     Height 04/16/22 0727 5\' 11"  (1.803 m)     Head Circumference --      Peak Flow --      Pain Score 04/16/22 0727 8     Pain Loc --      Pain Edu? --      Excl. in [place]? --     Most recent vital signs: Vitals:   04/16/22 0731  BP: 123/69  Pulse: (!) 54  Resp: 18  Temp: 98.2 F (36.8 C)  SpO2: 95%     General: Awake, no distress.  CV:  Good peripheral perfusion.  Resp:  Normal effort.  Abd:  No distention.  Other:  Nonfocal left-sided lower back tenderness to light touch on exam   ED Results / Procedures / Treatments   Labs (all labs ordered are listed, but only abnormal results are displayed) Labs Reviewed - No data to display   EKG  Not indicated   RADIOLOGY  Not indicated.   PROCEDURES:  Critical Care performed: No  Procedures   MEDICATIONS ORDERED IN ED: Medications - No data to display   IMPRESSION / MDM / [place] / ED COURSE  I reviewed the triage vital signs and the nursing notes.                              Differential diagnosis includes, but is not limited to musculoskeletal strain, functional pain  Patient's presentation is most consistent with acute, uncomplicated illness.  36 year old male  presenting to the emergency department for treatment and evaluation of acute onset low back pain without any known injury.  Exam is very reassuring.  Patient is ambulatory without assistance.  He will be treated with prescriptions as listed below.  Encouraged to follow-up with his primary care provider if not improving over the next few days.  He was encouraged to return to the emergency department for symptoms of change or worsen if he is unable to get an appointment.     FINAL CLINICAL IMPRESSION(S) / ED DIAGNOSES   Final diagnoses:  Acute left-sided low back pain without sciatica     Rx / DC Orders   ED Discharge Orders          Ordered    naproxen (NAPROSYN) 500 MG tablet  2 times daily with meals        04/16/22 0809    methocarbamol (ROBAXIN) 500 MG tablet  4 times daily        04/16/22 0809  Note:  This document was prepared using Dragon voice recognition software and may include unintentional dictation errors.   Chinita Pester, FNP 04/16/22 1224    Jene Every, MD 04/16/22 [phone]

## 2022-06-18 ENCOUNTER — Encounter: Payer: Self-pay | Admitting: Emergency Medicine

## 2022-06-18 ENCOUNTER — Emergency Department
Admission: EM | Admit: 2022-06-18 | Discharge: 2022-06-18 | Disposition: A | Discharge status: Home / Self Care | Payer: BC Managed Care – PPO | Attending: Emergency Medicine | Admitting: Emergency Medicine

## 2022-06-18 ENCOUNTER — Other Ambulatory Visit: Payer: Self-pay

## 2022-06-18 DIAGNOSIS — H6690 Otitis media, unspecified, unspecified ear: Secondary | ICD-10-CM

## 2022-06-18 DIAGNOSIS — H6691 Otitis media, unspecified, right ear: Secondary | ICD-10-CM | POA: Diagnosis not present

## 2022-06-18 DIAGNOSIS — R590 Localized enlarged lymph nodes: Secondary | ICD-10-CM | POA: Diagnosis not present

## 2022-06-18 DIAGNOSIS — H9201 Otalgia, right ear: Secondary | ICD-10-CM | POA: Diagnosis present

## 2022-06-18 MED ORDER — AMOXICILLIN 500 MG PO CAPS
500.0000 mg | ORAL_CAPSULE | Freq: Two times a day (BID) | ORAL | 0 refills | Status: DC
Start: 2022-06-18 — End: 2022-12-17

## 2022-06-18 NOTE — ED Triage Notes (Signed)
Pt reports RT ear pain x 1 week, denies other sx's.

## 2022-06-18 NOTE — ED Provider Notes (Signed)
   [facility] Provider Note    Event Date/Time   First MD Initiated Contact with Patient 06/18/22 [phone]     (approximate)   History   Otalgia   HPI  Cody Mccarty is a 37 y.o. male with no significant past medical history presents with complaints of right ear pain, reports has been hurting for about 5 to 7 days.  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  Also has some anterior ear pain.  Nondiabetic     Physical Exam   Triage Vital Signs: ED Triage Vitals  Enc Vitals Group     BP 06/18/22 0722 119/67     Pulse Rate 06/18/22 0722 (!) 54     Resp 06/18/22 0722 20     Temp 06/18/22 0722 98.4 F (36.9 C)     Temp Source 06/18/22 0722 Oral     SpO2 06/18/22 0722 98 %     Weight 06/18/22 0722 83 kg (182 lb 15.7 oz)     Height 06/18/22 0722 1.803 m (5\' 11" )     Head Circumference --      Peak Flow --      Pain Score 06/18/22 0721 7     Pain Loc --      Pain Edu? --      Excl. in [employer]? --     Most recent vital signs: Vitals:   06/18/22 0722  BP: 119/67  Pulse: (!) 54  Resp: 20  Temp: 98.4 F (36.9 C)  SpO2: 98%     General: Awake, no distress.  CV:  Good peripheral perfusion.  Resp:  Normal effort.  Abd:  No distention.  Other:  Ear exam, tenderness anteriorly, positive lymphadenopathy, some bulging of the eardrum   ED Results / Procedures / Treatments   Labs (all labs ordered are listed, but only abnormal results are displayed) Labs Reviewed - No data to display   EKG     RADIOLOGY     PROCEDURES:  Critical Care performed:   Procedures   MEDICATIONS ORDERED IN ED: Medications - No data to display   IMPRESSION / MDM / [place] / ED COURSE  I reviewed the triage vital signs and the nursing notes. Patient's presentation is most consistent with acute, uncomplicated illness.  Suspect otitis media given exam lymphadenopathy, will treat with antibiotics        FINAL CLINICAL IMPRESSION(S) / ED  DIAGNOSES   Final diagnoses:  Acute otitis media, unspecified otitis media type     Rx / DC Orders   ED Discharge Orders          Ordered    amoxicillin (AMOXIL) 500 MG capsule  2 times daily        06/18/22 9604             Note:  This document was prepared using Dragon voice recognition software and may include unintentional dictation errors.   Lavonia Drafts, MD 06/18/22 [phone]

## 2022-06-28 ENCOUNTER — Other Ambulatory Visit: Payer: Self-pay

## 2022-06-28 ENCOUNTER — Encounter: Payer: Self-pay | Admitting: Emergency Medicine

## 2022-06-28 ENCOUNTER — Emergency Department: Payer: BC Managed Care – PPO

## 2022-06-28 ENCOUNTER — Emergency Department
Admission: EM | Admit: 2022-06-28 | Discharge: 2022-06-28 | Disposition: A | Discharge status: Home / Self Care | Payer: BC Managed Care – PPO | Attending: Emergency Medicine | Admitting: Emergency Medicine

## 2022-06-28 DIAGNOSIS — S60222A Contusion of left hand, initial encounter: Secondary | ICD-10-CM

## 2022-06-28 DIAGNOSIS — S6992XA Unspecified injury of left wrist, hand and finger(s), initial encounter: Secondary | ICD-10-CM

## 2022-06-28 DIAGNOSIS — W230XXA Caught, crushed, jammed, or pinched between moving objects, initial encounter: Secondary | ICD-10-CM | POA: Insufficient documentation

## 2022-06-28 MED ORDER — KETOROLAC TROMETHAMINE 30 MG/ML IJ SOLN
30.0000 mg | Freq: Once | INTRAMUSCULAR | Status: AC
  Administered 2022-06-28: 30 mg via INTRAMUSCULAR
  Filled 2022-06-28: qty 1

## 2022-06-28 NOTE — Discharge Instructions (Addendum)
Please take Tylenol and ibuprofen/Advil for your pain.  It is safe to take them together, or to alternate them every few hours.  Take up to 1000mg of Tylenol at a time, up to 4 times per day.  Do not take more than 4000 mg of Tylenol in 24 hours.  For ibuprofen, take 400-600 mg, 3 - 4 times per day.  

## 2022-06-28 NOTE — ED Triage Notes (Signed)
Pt to ED from home c/o left hand pain.  States smashed hand while moving refrigerator between the wall 3 days ago.  Pain to left 4th and 5th knuckles, some swelling no obvious deformity, pain bending fingers.

## 2022-06-28 NOTE — ED Provider Notes (Signed)
[facility] Provider Note    Event Date/Time   First MD Initiated Contact with Patient 06/28/22 [phone]     (approximate)   History   Hand Injury   HPI  Cody Mccarty is a 37 y.o. male who presents to the ED for evaluation of Hand Injury   Left-hand-dominant patient presents to the ED 3 days after an injury to his left hand.  Reports he was moving a refrigerator when it accidentally got crushed/squeezed between the wall and the refrigerator itself.  Reports increasing pain over the past couple days, largely to the fourth and fifth metacarpals.  Reports that his causing difficulty with him performing his work so he presents to the ED for evaluation.   Physical Exam   Triage Vital Signs: ED Triage Vitals [06/28/22 0601]  Enc Vitals Group     BP 122/66     Pulse Rate (!) 55     Resp 14     Temp 98.3 F (36.8 C)     Temp Source Oral     SpO2 98 %     Weight 185 lb (83.9 kg)     Height 5\' 10"  (1.778 m)     Head Circumference      Peak Flow      Pain Score 7     Pain Loc      Pain Edu?      Excl. in [place]?     Most recent vital signs: Vitals:   06/28/22 0601  BP: 122/66  Pulse: (!) 55  Resp: 14  Temp: 98.3 F (36.8 C)  SpO2: 98%    General: Awake, no distress.  CV:  Good peripheral perfusion.  Resp:  Normal effort.  Abd:  No distention.  MSK:  Subtle soft tissue swelling over 4th/5th metacarpals on the left, and tenderness to palpation over this as well.  No skin changes or signs of open injury.  Full active and passive range of motion, but causing pain to this same area when doing so.  Nontender forearm, wrist, fingers.  Nontender metacarpals 1-3. Neuro:  No focal deficits appreciated. Other:     ED Results / Procedures / Treatments   Labs (all labs ordered are listed, but only abnormal results are displayed) Labs Reviewed - No data to display  EKG   RADIOLOGY X-ray of the left hand interpreted by me without evidence of  fracture or dislocation  Official radiology report(s): DG Hand Complete Left  Result Date: 06/28/2022 CLINICAL DATA:  Left hand injury third and fourth digits. EXAM: LEFT HAND - COMPLETE 3+ VIEW COMPARISON:  None Available. FINDINGS: There is no evidence of fracture or dislocation. There is no evidence of arthropathy or other focal bone abnormality. Soft tissues are unremarkable. IMPRESSION: Negative. Electronically Signed   By: Telford Nab M.D.   On: 06/28/2022 06:28    PROCEDURES and INTERVENTIONS:  Procedures  Medications  ketorolac (TORADOL) 30 MG/ML injection 30 mg (30 mg Intramuscular Given 06/28/22 4098)     IMPRESSION / MDM / ASSESSMENT AND PLAN / ED COURSE  I reviewed the triage vital signs and the nursing notes.  Differential diagnosis includes, but is not limited to, fracture, dislocation, open injury, MSK strain  Generally healthy 37 year old presents to the ED 3 days after crushing his left hand, without evidence of fracture or dislocation, likely MSK strain and swelling, suitable for outpatient management.  X-ray without evidence of fracture.  No clear signs of fracture on exam and  no evidence of tendinous disruption.  We discussed conservative measures, we provide a Velcro brace and he is suitable for outpatient management.  Clinical Course as of 06/28/22 0707  Fri Jun 28, 2022  0639 Reassessed and discussed reassuring x-ray results.  Discussed conservative management at home. [DS]    Clinical Course User Index [DS] Vladimir Crofts, MD     FINAL CLINICAL IMPRESSION(S) / ED DIAGNOSES   Final diagnoses:  Contusion of left hand, initial encounter  Injury of left hand, initial encounter     Rx / DC Orders   ED Discharge Orders     None        Note:  This document was prepared using Dragon voice recognition software and may include unintentional dictation errors.   Vladimir Crofts, MD 06/28/22 [phone]

## 2022-12-17 ENCOUNTER — Other Ambulatory Visit: Payer: Self-pay

## 2022-12-17 ENCOUNTER — Encounter: Payer: Self-pay | Admitting: Emergency Medicine

## 2022-12-17 ENCOUNTER — Emergency Department
Admission: EM | Admit: 2022-12-17 | Discharge: 2022-12-17 | Disposition: A | Discharge status: Home / Self Care | Payer: BC Managed Care – PPO | Attending: Emergency Medicine | Admitting: Emergency Medicine

## 2022-12-17 DIAGNOSIS — K649 Unspecified hemorrhoids: Secondary | ICD-10-CM | POA: Diagnosis not present

## 2022-12-17 DIAGNOSIS — K6289 Other specified diseases of anus and rectum: Secondary | ICD-10-CM | POA: Diagnosis present

## 2022-12-17 NOTE — ED Triage Notes (Signed)
Pt sts that he has hemorrhoids and he is not able to work due to not being able to lift due to the pain.

## 2022-12-17 NOTE — ED Provider Notes (Signed)
   [facility] Provider Note    Event Date/Time   First MD Initiated Contact with Patient 12/17/22 1315     (approximate)   History   Hemorrhoids   HPI  Cody Mccarty is a 37 y.o. male who presents with complaints of rectal pain.  Patient reports that he thinks it is a hemorrhoid.  He is heterosexual.  Reports pain developed this couple of days ago, worsened with constipation and heavy lifting at his job     Physical Exam   Triage Vital Signs: ED Triage Vitals  Enc Vitals Group     BP 12/17/22 1307 123/65     Pulse Rate 12/17/22 1307 (!) 55     Resp 12/17/22 1307 18     Temp 12/17/22 1307 98.3 F (36.8 C)     Temp Source 12/17/22 1307 Oral     SpO2 12/17/22 1307 100 %     Weight 12/17/22 1306 81.6 kg (180 lb)     Height 12/17/22 1306 1.778 m (5\' 10" )     Head Circumference --      Peak Flow --      Pain Score 12/17/22 1306 6     Pain Loc --      Pain Edu? --      Excl. in [place]? --     Most recent vital signs: Vitals:   12/17/22 1307  BP: 123/65  Pulse: (!) 55  Resp: 18  Temp: 98.3 F (36.8 C)  SpO2: 100%     General: Awake, no distress.  CV:  Good peripheral perfusion.  Resp:  Normal effort.  Abd:  No distention.  Other:  Rectal exam: No evidence of external hemorrhoid at this time, no evidence of tear or abscess   ED Results / Procedures / Treatments   Labs (all labs ordered are listed, but only abnormal results are displayed) Labs Reviewed - No data to display   EKG     RADIOLOGY     PROCEDURES:  Critical Care performed:   Procedures   MEDICATIONS ORDERED IN ED: Medications - No data to display   IMPRESSION / MDM / ASSESSMENT AND PLAN / ED COURSE  I reviewed the triage vital signs and the nursing notes. Patient's presentation is most consistent with acute, uncomplicated illness.  Patient presents with symptoms of rectal pain, differential includes hemorrhoids, perianal abscess.  Exam is overall  reassuring, suspect internal hemorrhoid causing his pain.  Discussed proper stooling, laxatives, Preparation H        FINAL CLINICAL IMPRESSION(S) / ED DIAGNOSES   Final diagnoses:  Hemorrhoids, unspecified hemorrhoid type     Rx / DC Orders   ED Discharge Orders     None        Note:  This document was prepared using Dragon voice recognition software and may include unintentional dictation errors.   Jene Every, MD 12/17/22 1352

## 2022-12-24 ENCOUNTER — Encounter: Payer: Self-pay | Admitting: Emergency Medicine

## 2022-12-24 ENCOUNTER — Other Ambulatory Visit: Payer: Self-pay

## 2022-12-24 ENCOUNTER — Emergency Department
Admission: EM | Admit: 2022-12-24 | Discharge: 2022-12-24 | Disposition: A | Discharge status: Home / Self Care | Payer: BC Managed Care – PPO | Source: Home / Self Care | Attending: Emergency Medicine | Admitting: Emergency Medicine

## 2022-12-24 DIAGNOSIS — K6289 Other specified diseases of anus and rectum: Secondary | ICD-10-CM | POA: Diagnosis present

## 2022-12-24 DIAGNOSIS — K648 Other hemorrhoids: Secondary | ICD-10-CM | POA: Diagnosis not present

## 2022-12-24 MED ORDER — HYDROCODONE-ACETAMINOPHEN 5-325 MG PO TABS: 1.0000 | ORAL_TABLET | Freq: Four times a day (QID) | ORAL | 0 refills | Status: DC | PRN

## 2022-12-24 MED ORDER — LIDOCAINE 5 % EX OINT
1.0000 | TOPICAL_OINTMENT | CUTANEOUS | 0 refills | Status: DC | PRN
Start: 2022-12-24 — End: 2023-01-20

## 2022-12-24 MED ORDER — HYDROCORTISONE ACETATE 25 MG RE SUPP: 25.0000 mg | Freq: Two times a day (BID) | RECTAL | 1 refills | Status: DC

## 2022-12-24 NOTE — Discharge Instructions (Signed)
Use medication as prescribed.  Continue to use a stool softener such as MiraLAX or Metamucil daily.  He needs a stool to be very soft because if it hardens it will make it worse.  Do not sit on the toilet.  If you feel like he have to have a bowel movement go and have a bowel movement and then get back up.  The pressure of sitting on the toilet for an extended amount of time will cause the hemorrhoids to worsen.  Try not to strain when having a bowel movement

## 2022-12-24 NOTE — ED Provider Triage Note (Signed)
Emergency Medicine Provider Triage Evaluation Note  Cody Mccarty , a 37 y.o. male  was evaluated in triage.  Pt complains of hemorrhoid. No relief with OTC medications. No bleeding. Evaluated here for the same on 12/17/22.  Physical Exam  BP (!) 121/92 (BP Location: Left Arm)   Pulse 65   Temp 98.7 F (37.1 C) (Oral)   Resp 16   Ht 5\' 10"  (1.778 m)   Wt 81.6 kg   SpO2 97%   BMI 25.81 kg/m  Gen:   Awake, no distress   Resp:  Normal effort  MSK:   Moves extremities without difficulty  Other:    Medical Decision Making  Medically screening exam initiated at 11:41 AM.  Appropriate orders placed.  Cody Mccarty was informed that the remainder of the evaluation will be completed by another provider, this initial triage assessment does not replace that evaluation, and the importance of remaining in the ED until their evaluation is complete.    Chinita Pester, FNP 12/24/22 1142

## 2022-12-24 NOTE — ED Provider Notes (Signed)
[facility] Provider Note    Event Date/Time   First MD Initiated Contact with Patient 12/24/22 1236     (approximate)   History   Hemorrhoids   HPI  Cody Mccarty is a 37 y.o. male with no significant past medical history presents emergency department with worsening rectal pain.  Patient states he was diagnosed with hemorrhoids over a week ago.  Was told to continue the Preparation H with lidocaine and keep his stools soft.  States pain has increased.  States he has to lift a lot and squat at work and is causing severe pain.  he denies fever or chills.  No redness swelling noted on the external area by the patient.      Physical Exam   Triage Vital Signs: ED Triage Vitals  Enc Vitals Group     BP 12/24/22 1139 (!) 121/92     Pulse Rate 12/24/22 1139 65     Resp 12/24/22 1139 16     Temp 12/24/22 1139 98.7 F (37.1 C)     Temp Source 12/24/22 1139 Oral     SpO2 12/24/22 1139 97 %     Weight 12/24/22 1140 179 lb 14.3 oz (81.6 kg)     Height 12/24/22 1140 5\' 10"  (1.778 m)     Head Circumference --      Peak Flow --      Pain Score 12/24/22 1140 10     Pain Loc --      Pain Edu? --      Excl. in GC? --     Most recent vital signs: Vitals:   12/24/22 1139  BP: (!) 121/92  Pulse: 65  Resp: 16  Temp: 98.7 F (37.1 C)  SpO2: 97%     General: Awake, no distress.   CV:  Good peripheral perfusion. regular rate and  rhythm Resp:  Normal effort.  Abd:  No distention.   Other:  Rectal exam: No external hemorrhoids noted, very inflamed internal hemorrhoids noted.  There are several.  Unsure if they are thrombosed as cannot visualize.  No fissure or abscess noted   ED Results / Procedures / Treatments   Labs (all labs ordered are listed, but only abnormal results are displayed) Labs Reviewed - No data to display   EKG     RADIOLOGY     PROCEDURES:   Procedures   MEDICATIONS ORDERED IN ED: Medications - No data to  display   IMPRESSION / MDM / ASSESSMENT AND PLAN / ED COURSE  I reviewed the triage vital signs and the nursing notes.                              Differential diagnosis includes, but is not limited to, internal hemorrhoids, thrombosed hemorrhoid, fissure, abscess  Patient's presentation is most consistent with acute complicated illness / injury requiring diagnostic workup.   Due to the hemorrhoids being very inflamed, will place patient on Anusol HC, lidocaine 5% ointment, and hydrocodone for pain.  He is to continue take MiraLAX or Metamucil for stool softener.  Follow-up with surgery.  I feel that it is important for him to see a surgeon concerning these hemorrhoids.  He is in agreement treatment plan.  Discharged stable condition.      FINAL CLINICAL IMPRESSION(S) / ED DIAGNOSES   Final diagnoses:  Inflamed internal hemorrhoid     Rx / DC Orders  ED Discharge Orders          Ordered    hydrocortisone (ANUSOL-HC) 25 MG suppository  Every 12 hours        12/24/22 1256    lidocaine (XYLOCAINE) 5 % ointment  As needed        12/24/22 1256    HYDROcodone-acetaminophen (NORCO/VICODIN) 5-325 MG tablet  Every 6 hours PRN        12/24/22 1256             Note:  This document was prepared using Dragon voice recognition software and may include unintentional dictation errors.    Faythe Ghee, PA-C 12/24/22 1301    Chesley Noon, MD 12/24/22 [phone]

## 2022-12-24 NOTE — ED Triage Notes (Signed)
Pt here with hemorrhoids x1 week. Pt tried cream and wipes with no relief. Pt denies bleeding.

## 2022-12-30 ENCOUNTER — Ambulatory Visit (INDEPENDENT_AMBULATORY_CARE_PROVIDER_SITE_OTHER): Payer: BC Managed Care – PPO | Admitting: Surgery

## 2022-12-30 ENCOUNTER — Encounter: Payer: Self-pay | Admitting: Surgery

## 2022-12-30 VITALS — BP 113/70 | HR 50 | Temp 98.9°F | Ht 70.0 in | Wt 184.2 lb

## 2022-12-30 DIAGNOSIS — K602 Anal fissure, unspecified: Secondary | ICD-10-CM

## 2022-12-30 NOTE — Patient Instructions (Addendum)
Please pick up your prescription for Nefedipine at [facility] in [place].  [address] [place], [place] [address]                                [phone]  If you have any concerns or questions, please feel free to call our office.    Anal Fissure, Adult  An anal fissure is a small tear or crack in the tissue near the opening of the butt (anus). In most cases, bleeding from the tear or crack stops on its own within a few minutes. You may have bleeding each time you poop until the tear or crack heals. What are the causes? Passing a large or hard poop (stool). Having trouble pooping (constipation). Having watery poops (diarrhea). An inflammatory bowel disease, like Crohn's disease or ulcerative colitis. Childbirth. Infections. Anal sex. What are the signs or symptoms? Bleeding from the butt. Small amounts of blood on your poop. The blood coats the outside of the poop. It is not mixed with the poop. Small amounts of blood on the toilet paper or in the toilet after you poop. Pain when you poop. Itching or irritation around your butt. How is this treated? Treatment may include: Making changes to what you eat and drink. This can help if you have trouble pooping. Taking fiber supplements. These can help make your poop soft. Taking warm water baths (sitz baths). These can help heal the tear. Using creams and ointments. If other treatments do not work, you may need: A shot near the tear or crack (botulinum injection). Surgery to fix the tear or crack. Follow these instructions at home: Medicines Take over-the-counter and prescription medicines only as told by your doctor. This includes creams and ointments that have medicine in them. Use medicines to make your poop soft as told by your doctor. Treating constipation You may need to take these actions to prevent or treat trouble pooping: Drink enough fluid to keep your pee (urine) pale yellow. Eat foods that are high in fiber. These  include beans, whole grains, and fresh fruits and vegetables. Stay away from unripe bananas. Ripe bananas are a good choice. Limit foods that are high in fat and sugar. These include fried or sweet foods. Avoid dairy products. This includes milk.  General instructions  Keep the butt area clean and dry. Take a warm water bath as told by your doctor. Do not use soap. Contact a doctor if: You have more bleeding. You have a fever. You have watery poop that is mixed with blood. Your pain does not go away. Your problems get worse. This information is not intended to replace advice given to you by your health care provider. Make sure you discuss any questions you have with your health care provider. Document Revised: 06/20/2022 Document Reviewed: 06/20/2022 Elsevier Patient Education  2024 [employer].

## 2022-12-31 ENCOUNTER — Encounter: Payer: Self-pay | Admitting: Surgery

## 2022-12-31 NOTE — Progress Notes (Signed)
Patient ID: Cody Mccarty, male   DOB: 1985/08/23, 37 y.o.   MRN: [id number]  HPI Cody Mccarty is a 37 y.o. male seen in consultation at the request of Ms. Fisher PA-C.  The patient endorses significant anorectal pain for the last 2 to 3 weeks.  Pain is intermittent and worsening when passing a bowel movement.  He actually feels that he is passing razor blades.  Pain is sharp severe.  No major bleeding.  No prior hemorrhoid surgery.  He has been using multiple creams to include Preparation H and Anusol without significant relief. He has been to the emergency room twice. He has no significant medical issues and he does have chronic anemia with hemoglobin of 11.6, normal BMP. No prior colonoscopy no evidence of Crohn's or irritable bowel disease in his family Did have major right hand trauma requiring surgery and did not have any complications.  HPI  Past Medical History:  Diagnosis Date   Medical history non-contributory     Past Surgical History:  Procedure Laterality Date   CLOSED REDUCTION FINGER WITH PERCUTANEOUS PINNING Right 03/14/2016   Procedure: CLOSED REDUCTION FINGER WITH PERCUTANEOUS PINNING;  Surgeon: Kennedy Bucker, MD;  Location: [facility] ORS;  Service: Orthopedics;  Laterality: Right;   NO PAST SURGERIES     OPEN REDUCTION INTERNAL FIXATION (ORIF) METACARPAL Right 03/14/2016   Procedure: OPEN REDUCTION INTERNAL FIXATION (ORIF) METACARPAL;  Surgeon: Kennedy Bucker, MD;  Location: [facility] ORS;  Service: Orthopedics;  Laterality: Right;    History reviewed. No pertinent family history.  Social History Social History   Tobacco Use   Smoking status: Every Day    Current packs/day: 0.50    Types: Cigarettes   Smokeless tobacco: Never  Substance Use Topics   Alcohol use: Yes   Drug use: No    No Known Allergies  Current Outpatient Medications  Medication Sig Dispense Refill   HYDROcodone-acetaminophen (NORCO/VICODIN) 5-325 MG tablet Take 1 tablet by mouth every 6 (six)  hours as needed for moderate pain. 15 tablet 0   hydrocortisone (ANUSOL-HC) 25 MG suppository Place 1 suppository (25 mg total) rectally every 12 (twelve) hours. 12 suppository 1   lidocaine (XYLOCAINE) 5 % ointment Apply 1 Application topically as needed. 35.44 g 0   No current facility-administered medications for this visit.     Review of Systems Full ROS  was asked and was negative except for the information on the HPI  Physical Exam Blood pressure 113/70, pulse (!) 50, temperature 98.9 F (37.2 C), temperature source Oral, height 5\' 10"  (1.778 m), weight 184 lb 3.2 oz (83.6 kg), SpO2 99%. CONSTITUTIONAL: NAD. EYES: Pupils are equal, round,  Sclera are non-icteric. EARS, NOSE, MOUTH AND THROAT: TThe oral mucosa is pink and moist. Hearing is intact to voice. LYMPH NODES:  Lymph nodes in the neck are normal. RESPIRATORY:  Lungs are clear. There is normal respiratory effort, with equal breath sounds bilaterally, and without pathologic use of accessory muscles. CARDIOVASCULAR: Heart is regular without murmurs, gallops, or rubs. GI: The abdomen is  soft, nontender, and nondistended. There are no palpable masses. There is no hepatosplenomegaly. There are normal bowel sounds in all quadrants.  Rectal : There is no evidence of perirectal abscess.  There is no evidence of thrombosed hemorrhoids.  The exam is very uncomfortable and is particularly tender in posterior midline.  I do see a fissure.  No masses no blood MUSCULOSKELETAL: Normal muscle strength and tone. No cyanosis or edema.   SKIN: Turgor is  good and there are no pathologic skin lesions or ulcers. NEUROLOGIC: Motor and sensation is grossly normal. Cranial nerves are grossly intact. PSYCH:  Oriented to person, place and time. Affect is normal.  Data Reviewed  I have personally reviewed the patient's imaging, laboratory findings and medical records.    Assessment/Plan 37 year old male with classic signs and symptoms consistent  with anal fissure.  Discussed with the patient in detail about his disease process and did counsel him extensively.  He needs fiber, sitz bath's.  He needs to stop using Preparation H or steroids as this can worsen the fissure.  He needs to start doing nifedipine cream on a twice daily basis.  After all this medical therapies I will see him back in about 3 weeks reexamine and reassess the possibility for chemical sphincterotomy with Botox. I Spent 40 minutes in this encounter including review of medical records, placing orders, counseling the patient and performing appropriate documentation. Operative this report was sent to the referring provider  Sterling Big, MD FACS General Surgeon 12/31/2022, 7:19 AM

## 2023-01-07 ENCOUNTER — Telehealth: Payer: Self-pay | Admitting: *Deleted

## 2023-01-07 NOTE — Telephone Encounter (Signed)
Faxed FMLA to [facility] at [phone]

## 2023-01-16 DIAGNOSIS — K602 Anal fissure, unspecified: Secondary | ICD-10-CM

## 2023-01-16 HISTORY — DX: Anal fissure, unspecified: K60.2

## 2023-01-20 ENCOUNTER — Encounter: Payer: Self-pay | Admitting: Surgery

## 2023-01-20 ENCOUNTER — Ambulatory Visit (INDEPENDENT_AMBULATORY_CARE_PROVIDER_SITE_OTHER): Payer: BC Managed Care – PPO | Admitting: Surgery

## 2023-01-20 VITALS — BP 114/73 | HR 54 | Temp 98.4°F | Ht 70.0 in | Wt 187.8 lb

## 2023-01-20 DIAGNOSIS — K602 Anal fissure, unspecified: Secondary | ICD-10-CM

## 2023-01-20 MED ORDER — ONABOTULINUMTOXINA 100 UNITS IJ SOLR: 50.0000 [IU] | Freq: Once | INTRAMUSCULAR | Status: DC

## 2023-01-20 NOTE — Progress Notes (Signed)
Outpatient Surgical Follow Up  01/20/2023  Cody Mccarty is an 37 y.o. male.   Chief Complaint  Patient presents with   Follow-up    Anal fissure    HPI: Persistent anal fissure.  He still having pain and burning when having bowel movement or when he sits down for prolonged periods.  He states that the nifedipine cream helped him some but he still having significant symptoms. Mild abdominal pain related to constipation.  No fevers no chills.  Taking p.o. well  Past Medical History:  Diagnosis Date   Medical history non-contributory     Past Surgical History:  Procedure Laterality Date   CLOSED REDUCTION FINGER WITH PERCUTANEOUS PINNING Right 03/14/2016   Procedure: CLOSED REDUCTION FINGER WITH PERCUTANEOUS PINNING;  Surgeon: Kennedy Bucker, MD;  Location: [facility] ORS;  Service: Orthopedics;  Laterality: Right;   NO PAST SURGERIES     OPEN REDUCTION INTERNAL FIXATION (ORIF) METACARPAL Right 03/14/2016   Procedure: OPEN REDUCTION INTERNAL FIXATION (ORIF) METACARPAL;  Surgeon: Kennedy Bucker, MD;  Location: [facility] ORS;  Service: Orthopedics;  Laterality: Right;    History reviewed. No pertinent family history.  Social History:  reports that he has been smoking cigarettes. He has never used smokeless tobacco. He reports current alcohol use. He reports that he does not use drugs.  Allergies: No Known Allergies  Medications reviewed.    ROS Full ROS performed and is otherwise negative other than what is stated in HPI   BP 114/73   Pulse (!) 54   Temp 98.4 F (36.9 C) (Oral)   Ht 5\' 10"  (1.778 m)   Wt 187 lb 12.8 oz (85.2 kg)   SpO2 99%   BMI 26.95 kg/m   Physical Exam CONSTITUTIONAL: NAD. Chaperone present EYES: Pupils are equal, round,  Sclera are non-icteric. EARS, NOSE, MOUTH AND THROAT: TThe oral mucosa is pink and moist. Hearing is intact to voice. LYMPH NODES:  Lymph nodes in the neck are normal. RESPIRATORY:  Lungs are clear. There is normal respiratory effort, with  equal breath sounds bilaterally, and without pathologic use of accessory muscles. CARDIOVASCULAR: Heart is regular without murmurs, gallops, or rubs. GI: The abdomen is  soft, nontender, and nondistended. There are no palpable masses. There is no hepatosplenomegaly. There are normal bowel sounds in all quadrants.  Rectal : There is no evidence of perirectal abscess.  There is no evidence of thrombosed hemorrhoids.  The exam is very uncomfortable and is particularly tender in posterior midline.  No masses no blood MUSCULOSKELETAL: Normal muscle strength and tone. No cyanosis or edema.   SKIN: Turgor is good and there are no pathologic skin lesions or ulcers. NEUROLOGIC: Motor and sensation is grossly normal. Cranial nerves are grossly intact. PSYCH:  Oriented to person, place and time. Affect is normal.  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  Discussed with the patient in detail about next steps and I do think that he will benefit from chemical sphincterotomy with Botox.  Procedure discussed with him and girlfriend in detail.  The risks, benefits and possible complications including but not limited to: Bleeding, infection, transient incontinence, persistent pain.  He also understand that there is a chance that we may have to repeat that again. He is in agreement.  In the meantime continue fiber, sitz bath's and nifedipine cream. Note that I spent at least 30 minutes in this encounter including person reviewing imaging studies, coordinating his care, placing orders and performing documentation Ghislaine Harcum  Everlene Farrier, MD [facility] General Surgeon

## 2023-01-20 NOTE — Patient Instructions (Signed)
Our surgery scheduler Britta Mccreedy will call you within 24-48 hours to get you scheduled. If you have not heard from her after 48 hours, please call our office. Have the blue sheet available when she calls to write down important information.  If you have any concerns or questions, please feel free to call our office.   OnabotulinumtoxinA Injection (Medical Use) What is this medication? ONABOTULINUMTOXINA (o na BOTT you lye num tox in eh) treats severe muscle spasms. It may also be used to prevent migraine headaches. It can treat excessive sweating when other medications do not work well enough. This medicine may be used for other purposes; ask your health care provider or pharmacist if you have questions. COMMON BRAND NAME(S): Botox What should I tell my care team before I take this medication? They need to know if you have any of these conditions: Breathing problems Cerebral palsy spasms Difficulty urinating Heart problems History of surgery where this medication is going to be used Infection at the site where this medication is going to be used Myasthenia gravis or other neurologic disease Nerve or muscle disease Surgery plans Take medications that treat or prevent blood clots Thyroid problems An unusual or allergic reaction to botulinum toxin, albumin, other medications, foods, dyes, or preservatives Pregnant or trying to get pregnant Breast-feeding How should I use this medication? This medication is for injected into a muscle. It is given by your care team in a hospital or clinic setting. A special MedGuide will be given to you before each treatment. Be sure to read this information carefully each time. Talk to your care team about the use of this medication in children. While this medication may be prescribed for children as young as 2 years for selected conditions, precautions do apply. Overdosage: If you think you have taken too much of this medicine contact a poison control center  or emergency room at once. NOTE: This medicine is only for you. Do not share this medicine with others. What if I miss a dose? This does not apply. What may interact with this medication? Aminoglycoside antibiotics, such as gentamicin, neomycin, tobramycin Muscle relaxants Other botulinum toxin injections This list may not describe all possible interactions. Give your health care provider a list of all the medicines, herbs, non-prescription drugs, or dietary supplements you use. Also tell them if you smoke, drink alcohol, or use illegal drugs. Some items may interact with your medicine. What should I watch for while using this medication? Visit your care team for regular check ups. This medication will cause weakness in the muscle where it is injected. Tell your care team if you feel unusually weak in other muscles. Get medical help right away if you have problems with breathing, swallowing, or talking. This medication might make your eyelids droop or make you see blurry or double. If you have weak muscles or trouble seeing do not drive a car, use machinery, or do other dangerous activities. This medication contains albumin from human blood. It may be possible to pass an infection in this medication, but no cases have been reported. Talk to your care team about the risks and benefits of this medication. If your activities have been limited by your condition, go back to your regular routine slowly after treatment with this medication. What side effects may I notice from receiving this medication? Side effects that you should report to your care team as soon as possible: Allergic reactions--skin rash, itching, hives, swelling of the face, lips, tongue, or throat Dryness  or irritation of the eyes, eye pain, change in vision, sensitivity to light Infection--fever, chills, cough, sore throat, wounds that don't heal, pain or trouble when passing urine, general feeling of discomfort or being  unwell Spread of botulinum toxin effects--unusual weakness or fatigue, blurry or double vision, trouble swallowing, hoarseness or trouble speaking, trouble breathing, loss of bladder control Trouble passing urine Side effects that usually do not require medical attention (report these to your care team if they continue or are bothersome): Dry mouth Eyelid drooping Fatigue Headache Pain, redness, or irritation at injection site This list may not describe all possible side effects. Call your doctor for medical advice about side effects. You may report side effects to FDA at 1-800-FDA-1088. Where should I keep my medication? This medication is given in a hospital or clinic and will not be stored at home. NOTE: This sheet is a summary. It may not cover all possible information. If you have questions about this medicine, talk to your doctor, pharmacist, or health care provider.  2024 Elsevier/Gold Standard (2021-05-31 00:00:00)

## 2023-01-20 NOTE — H&P (View-Only) (Signed)
Outpatient Surgical Follow Up  01/20/2023  Cody Mccarty is an 37 y.o. male.   Chief Complaint  Patient presents with   Follow-up    Anal fissure    HPI: Persistent anal fissure.  He still having pain and burning when having bowel movement or when he sits down for prolonged periods.  He states that the nifedipine cream helped him some but he still having significant symptoms. Mild abdominal pain related to constipation.  No fevers no chills.  Taking p.o. well  Past Medical History:  Diagnosis Date   Medical history non-contributory     Past Surgical History:  Procedure Laterality Date   CLOSED REDUCTION FINGER WITH PERCUTANEOUS PINNING Right 03/14/2016   Procedure: CLOSED REDUCTION FINGER WITH PERCUTANEOUS PINNING;  Surgeon: Kennedy Bucker, MD;  Location: [facility] OR[facility];  Service: Orthopedics;  Laterality: Right;   NO PAST SURGERIES     OPEN REDUCTION INTERNAL FIXATION (ORIF) METACARPAL Right 03/14/2016   Procedure: OPEN REDUCTION INTERNAL FIXATION (ORIF) METACARPAL;  Surgeon: Kennedy Bucker, MD;  Location: [facility] ORS;  Service: Orthopedics;  Laterality: Right;    History reviewed. No pertinent family history.  Social History:  reports that he has been smoking cigarettes. He has never used smokeless tobacco. He reports current alcohol use. He reports that he does not use drugs.  Allergies: No Known Allergies  Medications reviewed.    ROS Full ROS performed and is otherwise negative other than what is stated in HPI   BP 114/73   Pulse (!) 54   Temp 98.4 F (36.9 C) (Oral)   Ht 5\' 10"  (1.778 m)   Wt 187 lb 12.8 oz (85.2 kg)   SpO2 99%   BMI 26.95 kg/m   Physical Exam CONSTITUTIONAL: NAD. Chaperone present EYES: Pupils are equal, round,  Sclera are non-icteric. EARS, NOSE, MOUTH AND THROAT: TThe oral mucosa is pink and moist. Hearing is intact to voice. LYMPH NODES:  Lymph nodes in the neck are normal. RESPIRATORY:  Lungs are clear. There is normal respiratory effort, with  equal breath sounds bilaterally, and without pathologic use of accessory muscles. CARDIOVASCULAR: Heart is regular without murmurs, gallops, or rubs. GI: The abdomen is  soft, nontender, and nondistended. There are no palpable masses. There is no hepatosplenomegaly. There are normal bowel sounds in all quadrants.  Rectal : There is no evidence of perirectal abscess.  There is no evidence of thrombosed hemorrhoids.  The exam is very uncomfortable and is particularly tender in posterior midline.  No masses no blood MUSCULOSKELETAL: Normal muscle strength and tone. No cyanosis or edema.   SKIN: Turgor is good and there are no pathologic skin lesions or ulcers. NEUROLOGIC: Motor and sensation is grossly normal. Cranial nerves are grossly intact. PSYCH:  Oriented to person, place and time. Affect is normal.  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  Discussed with the patient in detail about next steps and I do think that he will benefit from chemical sphincterotomy with Botox.  Procedure discussed with him and girlfriend in detail.  The risks, benefits and possible complications including but not limited to: Bleeding, infection, transient incontinence, persistent pain.  He also understand that there is a chance that we may have to repeat that again. He is in agreement.  In the meantime continue fiber, sitz bath's and nifedipine cream. Note that I spent at least 30 minutes in this encounter including person reviewing imaging studies, coordinating his care, placing orders and performing documentation Maysun Meditz  Everlene Farrier, MD [facility] General Surgeon

## 2023-01-21 ENCOUNTER — Telehealth: Payer: Self-pay | Admitting: Surgery

## 2023-01-21 ENCOUNTER — Encounter
Admission: RE | Admit: 2023-01-21 | Discharge: 2023-01-21 | Disposition: A | Discharge status: Home / Self Care | Payer: BC Managed Care – PPO | Source: Ambulatory Visit | Attending: Surgery | Admitting: Surgery

## 2023-01-21 NOTE — Telephone Encounter (Signed)
Patient has been advised of Pre-Admission date/time, and Surgery date at [facility].  Surgery Date: 01/23/23 Preadmission Testing Date: 01/21/23 (phone 1p-4p)  Patient has been made aware to call [phone], between 1-3:00pm the day before surgery, to find out what time to arrive for surgery.

## 2023-01-21 NOTE — Patient Instructions (Addendum)
Your procedure is scheduled on: Thursday, August 8 Report to the Registration Desk on the 1st floor of the [facility] To find out your arrival time, please call [phone] between 1PM - 3PM on: Wednesday, August 7 If your arrival time is 6:00 am, do not arrive before that time as the [facility] entrance doors do not open until 6:00 am.  REMEMBER: Instructions that are not followed completely may result in serious medical risk, up to and including death; or upon the discretion of your surgeon and anesthesiologist your surgery may need to be rescheduled.  Do not eat food after midnight the night before surgery.  No gum chewing or hard candies.  You may however, drink CLEAR liquids up to 2 hours before you are scheduled to arrive for your surgery. Do not drink anything within 2 hours of your scheduled arrival time.  Clear liquids include: - water  - apple juice without pulp - gatorade (not RED colors) - black coffee or tea (Do NOT add milk or creamers to the coffee or tea) Do NOT drink anything that is not on this list.  One week prior to surgery: Stop Anti-inflammatories (NSAIDS) such as Advil, Aleve, Ibuprofen, Motrin, Naproxen, Naprosyn and Aspirin based products such as Excedrin, Goody's Powder, BC Powder. Stop ANY OVER THE COUNTER supplements until after surgery. You may however, continue to take Tylenol if needed for pain up until the day of surgery.  DO NOT TAKE ANY MEDICATIONS THE MORNING OF SURGERY   No Alcohol for 24 hours before or after surgery.  No Smoking including e-cigarettes for 24 hours before surgery.  No chewable tobacco products for at least 6 hours before surgery.  No nicotine patches on the day of surgery.  Do not use any "recreational" drugs for at least a week (preferably 2 weeks) before your surgery.  Please be advised that the combination of cocaine and anesthesia may have negative outcomes, up to and including death. If you test positive for  cocaine, your surgery will be cancelled.  On the morning of surgery brush your teeth with toothpaste and water, you may rinse your mouth with mouthwash if you wish. Do not swallow any toothpaste or mouthwash.  Do not wear jewelry, make-up, hairpins, clips or nail polish.  Do not wear lotions, powders, or perfumes.   Do not shave body hair from the neck down 48 hours before surgery.  Contact lenses, hearing aids and dentures may not be worn into surgery.  Do not bring valuables to the hospital. [facility] is not responsible for any missing/lost belongings or valuables.   Notify your doctor if there is any change in your medical condition (cold, fever, infection).  Wear comfortable clothing (specific to your surgery type) to the hospital.  After surgery, you can help prevent lung complications by doing breathing exercises.  Take deep breaths and cough every 1-2 hours.   If you are being discharged the day of surgery, you will not be allowed to drive home. You will need a responsible individual to drive you home and stay with you for 24 hours after surgery.   If you are taking public transportation, you will need to have a responsible individual with you.  Please call the Pre-admissions Testing Dept. at [phone] if you have any questions about these instructions.  Surgery Visitation Policy:  Patients having surgery or a procedure may have two visitors.  Children under the age of 62 must have an adult with them who is not  the patient.

## 2023-01-23 ENCOUNTER — Other Ambulatory Visit: Payer: Self-pay

## 2023-01-23 ENCOUNTER — Encounter: Payer: Self-pay | Admitting: Surgery

## 2023-01-23 ENCOUNTER — Encounter
Admission: RE | Disposition: A | Discharge status: Home / Self Care | Payer: Self-pay | Source: Home / Self Care | Attending: Surgery

## 2023-01-23 ENCOUNTER — Ambulatory Visit
Admission: RE | Admit: 2023-01-23 | Discharge: 2023-01-23 | Disposition: A | Discharge status: Home / Self Care | Payer: BC Managed Care – PPO | Attending: Surgery | Admitting: Surgery

## 2023-01-23 ENCOUNTER — Ambulatory Visit: Payer: BC Managed Care – PPO | Admitting: Anesthesiology

## 2023-01-23 DIAGNOSIS — F17210 Nicotine dependence, cigarettes, uncomplicated: Secondary | ICD-10-CM | POA: Insufficient documentation

## 2023-01-23 DIAGNOSIS — K602 Anal fissure, unspecified: Secondary | ICD-10-CM | POA: Diagnosis present

## 2023-01-23 HISTORY — PX: SPHINCTEROTOMY: SHX5279

## 2023-01-23 SURGERY — EXAM UNDER ANESTHESIA
Anesthesia: General

## 2023-01-23 MED ORDER — BUPIVACAINE LIPOSOME 1.3 % IJ SUSP
INTRAMUSCULAR | Status: DC | PRN
  Administered 2023-01-23: 20 mL

## 2023-01-23 MED ORDER — ACETAMINOPHEN 500 MG PO TABS
1000.0000 mg | ORAL_TABLET | ORAL | Status: AC
  Administered 2023-01-23: 1000 mg via ORAL

## 2023-01-23 MED ORDER — CHLORHEXIDINE GLUCONATE CLOTH 2 % EX PADS
6.0000 | MEDICATED_PAD | Freq: Once | CUTANEOUS | Status: AC
  Administered 2023-01-23: 6 via TOPICAL

## 2023-01-23 MED ORDER — CHLORHEXIDINE GLUCONATE 0.12 % MT SOLN
15.0000 mL | Freq: Once | OROMUCOSAL | Status: AC
  Administered 2023-01-23: 15 mL via OROMUCOSAL

## 2023-01-23 MED ORDER — ORAL CARE MOUTH RINSE: 15.0000 mL | Freq: Once | OROMUCOSAL | Status: AC

## 2023-01-23 MED ORDER — PROPOFOL 500 MG/50ML IV EMUL
INTRAVENOUS | Status: DC | PRN
  Administered 2023-01-23: 70 mg via INTRAVENOUS
  Administered 2023-01-23: 10 mg via INTRAVENOUS
  Administered 2023-01-23: 125 ug/kg/min via INTRAVENOUS

## 2023-01-23 MED ORDER — CHLORHEXIDINE GLUCONATE CLOTH 2 % EX PADS: 6.0000 | MEDICATED_PAD | Freq: Once | CUTANEOUS | Status: DC

## 2023-01-23 MED ORDER — HYDROCODONE-ACETAMINOPHEN 5-325 MG PO TABS: 1.0000 | ORAL_TABLET | Freq: Four times a day (QID) | ORAL | 0 refills | Status: DC | PRN

## 2023-01-23 MED ORDER — PROPOFOL 1000 MG/100ML IV EMUL
INTRAVENOUS | Status: AC
  Filled 2023-01-23: qty 100

## 2023-01-23 MED ORDER — CELECOXIB 200 MG PO CAPS
ORAL_CAPSULE | ORAL | Status: AC
  Filled 2023-01-23: qty 1

## 2023-01-23 MED ORDER — SODIUM CHLORIDE (PF) 0.9 % IJ SOLN
INTRAMUSCULAR | Status: DC | PRN
Start: 2023-01-23 — End: 2023-01-23
  Administered 2023-01-23: 5 mL

## 2023-01-23 MED ORDER — FAMOTIDINE 20 MG PO TABS
ORAL_TABLET | ORAL | Status: AC
  Filled 2023-01-23: qty 1

## 2023-01-23 MED ORDER — DEXMEDETOMIDINE HCL IN NACL 200 MCG/50ML IV SOLN
INTRAVENOUS | Status: DC | PRN
  Administered 2023-01-23: 4 ug via INTRAVENOUS
  Administered 2023-01-23 (×2): 8 ug via INTRAVENOUS

## 2023-01-23 MED ORDER — FENTANYL CITRATE (PF) 100 MCG/2ML IJ SOLN
INTRAMUSCULAR | Status: AC
  Filled 2023-01-23: qty 2

## 2023-01-23 MED ORDER — FENTANYL CITRATE (PF) 250 MCG/5ML IJ SOLN
INTRAMUSCULAR | Status: DC | PRN
  Administered 2023-01-23 (×2): 25 ug via INTRAVENOUS

## 2023-01-23 MED ORDER — GABAPENTIN 300 MG PO CAPS
300.0000 mg | ORAL_CAPSULE | ORAL | Status: AC
  Administered 2023-01-23: 300 mg via ORAL

## 2023-01-23 MED ORDER — MIDAZOLAM HCL 2 MG/2ML IJ SOLN
INTRAMUSCULAR | Status: AC
  Filled 2023-01-23: qty 2

## 2023-01-23 MED ORDER — ONDANSETRON HCL 4 MG/2ML IJ SOLN
INTRAMUSCULAR | Status: DC | PRN
  Administered 2023-01-23: 4 mg via INTRAVENOUS

## 2023-01-23 MED ORDER — ACETAMINOPHEN 500 MG PO TABS
ORAL_TABLET | ORAL | Status: AC
  Filled 2023-01-23: qty 2

## 2023-01-23 MED ORDER — CHLORHEXIDINE GLUCONATE 0.12 % MT SOLN
OROMUCOSAL | Status: AC
  Filled 2023-01-23: qty 15

## 2023-01-23 MED ORDER — GABAPENTIN 300 MG PO CAPS
ORAL_CAPSULE | ORAL | Status: AC
  Filled 2023-01-23: qty 1

## 2023-01-23 MED ORDER — FAMOTIDINE 20 MG PO TABS
20.0000 mg | ORAL_TABLET | Freq: Once | ORAL | Status: AC
  Administered 2023-01-23: 20 mg via ORAL

## 2023-01-23 MED ORDER — MIDAZOLAM HCL 2 MG/2ML IJ SOLN
INTRAMUSCULAR | Status: DC | PRN
  Administered 2023-01-23: 2 mg via INTRAVENOUS

## 2023-01-23 MED ORDER — LACTATED RINGERS IV SOLN: INTRAVENOUS | Status: DC

## 2023-01-23 MED ORDER — DEXAMETHASONE SODIUM PHOSPHATE 4 MG/ML IJ SOLN
INTRAMUSCULAR | Status: DC | PRN
  Administered 2023-01-23: 5 mg via INTRAVENOUS

## 2023-01-23 MED ORDER — CELECOXIB 200 MG PO CAPS
200.0000 mg | ORAL_CAPSULE | ORAL | Status: AC
  Administered 2023-01-23: 200 mg via ORAL

## 2023-01-23 MED ORDER — ONABOTULINUMTOXINA 100 UNITS IJ SOLR
100.0000 [IU] | Freq: Once | INTRAMUSCULAR | Status: AC
  Administered 2023-01-23: 50 [IU] via INTRAMUSCULAR
  Filled 2023-01-23 (×2): qty 100

## 2023-01-23 MED ORDER — LIDOCAINE HCL (PF) 2 % IJ SOLN
INTRAMUSCULAR | Status: AC
  Filled 2023-01-23: qty 5

## 2023-01-23 SURGICAL SUPPLY — 31 items
BLADE SURG 15 STRL LF DISP TIS (BLADE) ×1 IMPLANT
BLADE SURG 15 STRL SS (BLADE) ×1
BRIEF MESH DISP 2XL (UNDERPADS AND DIAPERS) ×1 IMPLANT
DRAPE 3/4 80X56 (DRAPES) ×1 IMPLANT
DRAPE LEGGINS SURG 28X43 STRL (DRAPES) ×1 IMPLANT
DRAPE UNDER BUTTOCK W/FLU (DRAPES) ×1 IMPLANT
ELECT CAUTERY BLADE 6.4 (BLADE) ×1 IMPLANT
ELECT REM PT RETURN 9FT ADLT (ELECTROSURGICAL) ×1
ELECTRODE REM PT RTRN 9FT ADLT (ELECTROSURGICAL) ×1 IMPLANT
GAUZE 4X4 16PLY ~~LOC~~+RFID DBL (SPONGE) ×1 IMPLANT
GLOVE BIO SURGEON STRL SZ7 (GLOVE) ×1 IMPLANT
GOWN STRL REUS W/ TWL LRG LVL3 (GOWN DISPOSABLE) ×2 IMPLANT
GOWN STRL REUS W/TWL LRG LVL3 (GOWN DISPOSABLE) ×2
KIT TURNOVER CYSTO (KITS) ×1 IMPLANT
MANIFOLD NEPTUNE II (INSTRUMENTS) ×1 IMPLANT
NDL HPO THNWL 1X22GA REG BVL (NEEDLE) ×2 IMPLANT
NDL HYPO 22X1.5 SAFETY MO (MISCELLANEOUS) ×1 IMPLANT
NEEDLE HYPO 22X1.5 SAFETY MO (MISCELLANEOUS) ×1 IMPLANT
NEEDLE SAFETY 22GX1 (NEEDLE) ×2
NS IRRIG 1000ML POUR BTL (IV SOLUTION) ×1 IMPLANT
PACK BASIN MINOR ARMC (MISCELLANEOUS) ×1 IMPLANT
PAD OB MATERNITY 4.3X12.25 (PERSONAL CARE ITEMS) ×1 IMPLANT
PAD PREP OB/GYN DISP 24X41 (PERSONAL CARE ITEMS) ×1 IMPLANT
SOL PREP PVP 2OZ (MISCELLANEOUS) ×1
SOLUTION PREP PVP 2OZ (MISCELLANEOUS) ×1 IMPLANT
SPONGE T-LAP 18X18 ~~LOC~~+RFID (SPONGE) ×1 IMPLANT
SURGILUBE 2OZ TUBE FLIPTOP (MISCELLANEOUS) ×1 IMPLANT
SYR 10ML LL (SYRINGE) ×1 IMPLANT
SYR 20ML LL LF (SYRINGE) ×1 IMPLANT
TRAP FLUID SMOKE EVACUATOR (MISCELLANEOUS) ×1 IMPLANT
WATER STERILE IRR 500ML POUR (IV SOLUTION) ×1 IMPLANT

## 2023-01-23 NOTE — Anesthesia Postprocedure Evaluation (Signed)
Anesthesia Post Note  Patient: Cody Mccarty  Procedure(s) Performed: EXAM UNDER ANESTHESIA SPHINCTEROTOMY, chemical with Botox  Patient location during evaluation: PACU Anesthesia Type: General Level of consciousness: awake and oriented Pain management: satisfactory to patient Vital Signs Assessment: post-procedure vital signs reviewed and stable Respiratory status: spontaneous breathing Cardiovascular status: blood pressure returned to baseline Anesthetic complications: no   No notable events documented.   Last Vitals:  Vitals:   01/23/23 0917 01/23/23 1230  BP: 137/79 111/66  Pulse: (!) 54 65  Resp: 16 17  Temp: 36.9 C (!) 36.3 C  SpO2: 99% 96%    Last Pain:  Vitals:   01/23/23 1230  TempSrc:   PainSc: 0-No pain                 VAN STAVEREN,Tayquan Gassman

## 2023-01-23 NOTE — Anesthesia Preprocedure Evaluation (Addendum)
Anesthesia Evaluation  Patient identified by MRN, date of birth, ID band Patient awake    Reviewed: Allergy & Precautions, NPO status , Patient's Chart, lab work & pertinent test results  History of Anesthesia Complications Negative for: history of anesthetic complications  Airway Mallampati: III   Neck ROM: Full    Dental  (+) Missing   Pulmonary Current Smoker (5 cigarettes per day) and Patient abstained from smoking.   Pulmonary exam normal breath sounds clear to auscultation       Cardiovascular Exercise Tolerance: Good negative cardio ROS Normal cardiovascular exam Rhythm:Regular Rate:Normal     Neuro/Psych negative neurological ROS     GI/Hepatic negative GI ROS,,,  Endo/Other  negative endocrine ROS    Renal/GU negative Renal ROS     Musculoskeletal   Abdominal   Peds  Hematology negative hematology ROS (+)   Anesthesia Other Findings   Reproductive/Obstetrics                             Anesthesia Physical Anesthesia Plan  ASA: 2  Anesthesia Plan: General   Post-op Pain Management:    Induction: Intravenous  PONV Risk Score and Plan: 1 and Propofol infusion, TIVA, Treatment may vary due to age or medical condition and Ondansetron  Airway Management Planned: Natural Airway  Additional Equipment:   Intra-op Plan:   Post-operative Plan:   Informed Consent: I have reviewed the patients History and Physical, chart, labs and discussed the procedure including the risks, benefits and alternatives for the proposed anesthesia with the patient or authorized representative who has indicated his/her understanding and acceptance.       Plan Discussed with: CRNA  Anesthesia Plan Comments: (LMA/GETA backup discussed.  Patient consented for risks of anesthesia including but not limited to:  - adverse reactions to medications - damage to eyes, teeth, lips or other oral  mucosa - nerve damage due to positioning  - sore throat or hoarseness - damage to heart, brain, nerves, lungs, other parts of body or loss of life  Informed patient about role of CRNA in peri- and intra-operative care.  Patient voiced understanding.)       Anesthesia Quick Evaluation

## 2023-01-23 NOTE — Discharge Instructions (Addendum)
Lateral Internal Sphincterotomy, Care After After a lateral internal sphincterotomy, it is common to have: Pain in your rectum. You may have pain when you poop. Some tenderness and swelling. A small amount of fluid or blood coming from your rectum. Follow these instructions at home: Medicines Take over-the-counter and prescription medicines only as told by your health care provider. These include medicines that help you poop (laxatives) and soften your poop (stool softeners). If you were prescribed antibiotics, take them as told by your provider. Do not stop using the antibiotic even if you start to feel better. Ask your provider if the medicine prescribed to you: Requires you to avoid driving or using machinery. Can cause constipation. You may need to take these actions to prevent or treat constipation: Drink enough fluid to keep your pee (urine) pale yellow. Take over-the-counter or prescription medicines. Eat foods that are high in fiber, such as beans, whole grains, and fresh fruits and vegetables. Limit foods that are high in fat and processed sugars, such as fried or sweet foods. Incision care  Follow instructions from your provider about how to take care of your incision. Make sure you: Take sitz baths as told by your provider. A sitz bath is a warm water bath that you take while sitting down. The water should come up to your hips and cover your butt. You may be told to take several sitz baths a day. Wear a pad in your underwear to help absorb drainage. Change this pad as needed. Check the area around your rectum every day for signs of infection. Check for: Redness. More swelling or pain. Pus. Activity If you were given a sedative during the procedure, it can affect you for several hours. Do not drive or operate machinery until your provider says that it is safe. You may have to avoid lifting. Ask your provider how much you can safely lift. Return to your normal activities as told  by your provider. Ask your provider what activities are safe for you. General instructions Do not use any products that contain nicotine or tobacco. These products include cigarettes, chewing tobacco, and vaping devices, such as e-cigarettes. If you need help quitting, ask your provider. Do not strain to poop. Do not take baths, swim, or use a hot tub until your provider approves. Ask your provider if you may take showers. You may only be allowed to take sponge baths. Your provider may give you more instructions. Make sure you know what you can and cannot do. Contact a health care provider if: You have a fever. Your pain does not get better with medicine. You get constipated. You have any signs of infection. Get help right away if: You have severe pain. You have bleeding from your rectum that does not stop. You cannot pee (urinate). This information is not intended to replace advice given to you by your health care provider. Make sure you discuss any questions you have with your health care provider. Document Revised: 06/24/2022 Document Reviewed: 05/08/2022 Elsevier Patient Education  2024 Elsevier Inc.       AMBULATORY SURGERY  DISCHARGE INSTRUCTIONS   The drugs that you were given will stay in your system until tomorrow so for the next 24 hours you should not:  Drive an automobile Make any legal decisions Drink any alcoholic beverage   You may resume regular meals tomorrow.  Today it is better to start with liquids and gradually work up to solid foods.  You may eat anything you prefer, but  it is better to start with liquids, then soup and crackers, and gradually work up to solid foods.   Please notify your doctor immediately if you have any unusual bleeding, trouble breathing, redness and pain at the surgery site, drainage, fever, or pain not relieved by medication.    Additional Instructions:  DO REMOVE TEAL EXPAREL BRACELET FOR 4 DAYS, (96 hours)  01/27/2023  Information for Discharge Teaching: EXPAREL (bupivacaine liposome injectable suspension)   Your surgeon or anesthesiologist gave you EXPAREL(bupivacaine) to help control your pain after surgery.  EXPAREL is a local anesthetic that provides pain relief by numbing the tissue around the surgical site. EXPAREL is designed to release pain medication over time and can control pain for up to 72 hours. Depending on how you respond to EXPAREL, you may require less pain medication during your recovery.  Possible side effects: Temporary loss of sensation or ability to move in the area where bupivacaine was injected. Nausea, vomiting, constipation Rarely, numbness and tingling in your mouth or lips, lightheadedness, or anxiety may occur. Call your doctor right away if you think you may be experiencing any of these sensations, or if you have other questions regarding possible side effects.  Follow all other discharge instructions given to you by your surgeon or nurse. Eat a healthy diet and drink plenty of water or other fluids.  If you return to the hospital for any reason within 96 hours following the administration of EXPAREL, it is important for health care providers to know that you have received this anesthetic. A teal colored band has been placed on your arm with the date, time and amount of EXPAREL you have received in order to alert and inform your health care providers. Please leave this armband in place for the full 96 hours following administration, and then you may remove the band.        Please contact your physician with any problems or Same Day Surgery at [phone], Monday through Friday 6 am to 4 pm, or [place] [facility] number at [phone].

## 2023-01-23 NOTE — Op Note (Signed)
PRE-OPERATIVE DIAGNOSIS:  Anal fissure   POST-OPERATIVE DIAGNOSIS:  Anal fissure   PROCEDURE:   1. Anorectal Exam under Anesthesia 2. Chemical Lateral internal Sphincterotomy using 50 IU Botox   SURGEON:  Surgeon(s) and Role:    * Davinity Fanara F, MD - Primary   FINDINGS: posterior midline anal fissure   EBL: minimal   ANESTHESIA: General       DICTATION:  Patient was explained about the  Procedure in detail. Risks, benefits and possible complications ( including but not limited to recurrence, transient incontinence, pain, bleeding)  and a consent was obtained. The patient taken to the operating room and placed in the lithotomy position.    Exam under anesthesia using the anal speculum revealed post midline fissure.   No other intraluminal lesions were observed. Digital palpation was performed identifying the Internal Sphincter muscle and on the lateral aspect and injected 50 international units of Botox in the standard fashion. Liposomal Marcaine  was injected around the perianal site. Needle and laparotomy counts were correct and there were no immediate complications   Leafy Ro, MD, FACS

## 2023-01-23 NOTE — Transfer of Care (Signed)
Immediate Anesthesia Transfer of Care Note  Patient: Cody Mccarty  Procedure(s) Performed: EXAM UNDER ANESTHESIA SPHINCTEROTOMY, chemical with Botox  Patient Location: PACU  Anesthesia Type:General  Level of Consciousness: drowsy  Airway & Oxygen Therapy: Patient Spontanous Breathing  Post-op Assessment: Report given to RN and Post -op Vital signs reviewed and stable  Post vital signs: Reviewed and stable  Last Vitals:  Vitals Value Taken Time  BP 93/78 01/23/23 1226  Temp 97.5   Pulse 56 01/23/23 1228  Resp 12 01/23/23 1228  SpO2 96 % 01/23/23 1228  Vitals shown include unfiled device data.  Last Pain:  Vitals:   01/23/23 0917  TempSrc: Temporal  PainSc: 5          Complications: No notable events documented.

## 2023-01-23 NOTE — Interval H&P Note (Signed)
History and Physical Interval Note:  01/23/2023 9:50 AM  Cody Mccarty  has presented today for surgery, with the diagnosis of anal fissure.  The various methods of treatment have been discussed with the patient and family. After consideration of risks, benefits and other options for treatment, the patient has consented to  Procedure(s): EXAM UNDER ANESTHESIA (N/A) SPHINCTEROTOMY, chemical with Botox (N/A) as a surgical intervention.  The patient's history has been reviewed, patient examined, no change in status, stable for surgery.  I have reviewed the patient's chart and labs.  Questions were answered to the patient's satisfaction.     Cody Mccarty

## 2023-01-26 ENCOUNTER — Other Ambulatory Visit: Payer: Self-pay

## 2023-02-18 ENCOUNTER — Telehealth: Payer: Self-pay | Admitting: *Deleted

## 2023-02-18 NOTE — Telephone Encounter (Signed)
Patient called and wants to get a letter uploaded to his mychart stating he can return to work full time no restrictions on 02/20/23  He had surgery on 01/23/23 SPHINCTEROTOMY, chemical with Botox  Dr Everlene Farrier

## 2023-02-26 ENCOUNTER — Encounter: Payer: BC Managed Care – PPO | Admitting: Surgery

## 2023-03-19 ENCOUNTER — Encounter: Payer: Self-pay | Admitting: Surgery

## 2023-03-19 ENCOUNTER — Ambulatory Visit (INDEPENDENT_AMBULATORY_CARE_PROVIDER_SITE_OTHER): Payer: BC Managed Care – PPO | Admitting: Surgery

## 2023-03-19 VITALS — BP 114/71 | HR 64 | Temp 98.7°F | Ht 70.0 in | Wt 186.4 lb

## 2023-03-19 DIAGNOSIS — K602 Anal fissure, unspecified: Secondary | ICD-10-CM

## 2023-03-19 NOTE — Patient Instructions (Signed)
 OnabotulinumtoxinA Injection (Medical Use) What is this medication? ONABOTULINUMTOXINA (o na BOTT you lye num tox in eh) treats severe muscle spasms. It may also be used to prevent migraine headaches. It can treat excessive sweating when other medications do not work well enough. This medicine may be used for other purposes; ask your health care provider or pharmacist if you have questions. COMMON BRAND NAME(S): Botox What should I tell my care team before I take this medication? They need to know if you have any of these conditions: Breathing problems Cerebral palsy spasms Difficulty urinating Heart problems History of surgery where this medication is going to be used Infection at the site where this medication is going to be used Myasthenia gravis or other neurologic disease Nerve or muscle disease Surgery plans Take medications that treat or prevent blood clots Thyroid problems An unusual or allergic reaction to botulinum toxin, albumin, other medications, foods, dyes, or preservatives Pregnant or trying to get pregnant Breast-feeding How should I use this medication? This medication is for injected into a muscle. It is given by your care team in a hospital or clinic setting. A special MedGuide will be given to you before each treatment. Be sure to read this information carefully each time. Talk to your care team about the use of this medication in children. While this medication may be prescribed for children as young as 2 years for selected conditions, precautions do apply. Overdosage: If you think you have taken too much of this medicine contact a poison control center or emergency room at once. NOTE: This medicine is only for you. Do not share this medicine with others. What if I miss a dose? This does not apply. What may interact with this medication? Aminoglycoside antibiotics, such as gentamicin, neomycin, tobramycin Muscle relaxants Other botulinum toxin injections This  list may not describe all possible interactions. Give your health care provider a list of all the medicines, herbs, non-prescription drugs, or dietary supplements you use. Also tell them if you smoke, drink alcohol, or use illegal drugs. Some items may interact with your medicine. What should I watch for while using this medication? Visit your care team for regular check ups. This medication will cause weakness in the muscle where it is injected. Tell your care team if you feel unusually weak in other muscles. Get medical help right away if you have problems with breathing, swallowing, or talking. This medication might make your eyelids droop or make you see blurry or double. If you have weak muscles or trouble seeing do not drive a car, use machinery, or do other dangerous activities. This medication contains albumin from human blood. It may be possible to pass an infection in this medication, but no cases have been reported. Talk to your care team about the risks and benefits of this medication. If your activities have been limited by your condition, go back to your regular routine slowly after treatment with this medication. What side effects may I notice from receiving this medication? Side effects that you should report to your care team as soon as possible: Allergic reactions--skin rash, itching, hives, swelling of the face, lips, tongue, or throat Dryness or irritation of the eyes, eye pain, change in vision, sensitivity to light Infection--fever, chills, cough, sore throat, wounds that don't heal, pain or trouble when passing urine, general feeling of discomfort or being unwell Spread of botulinum toxin effects--unusual weakness or fatigue, blurry or double vision, trouble swallowing, hoarseness or trouble speaking, trouble breathing, loss of bladder  control Trouble passing urine Side effects that usually do not require medical attention (report these to your care team if they continue or are  bothersome): Dry mouth Eyelid drooping Fatigue Headache Pain, redness, or irritation at injection site This list may not describe all possible side effects. Call your doctor for medical advice about side effects. You may report side effects to FDA at 1-800-FDA-1088. Where should I keep my medication? This medication is given in a hospital or clinic and will not be stored at home. NOTE: This sheet is a summary. It may not cover all possible information. If you have questions about this medicine, talk to your doctor, pharmacist, or health care provider.  2024 Elsevier/Gold Standard (2021-05-31 00:00:00)

## 2023-03-21 NOTE — Progress Notes (Addendum)
Outpatient Surgical Follow Up   Cody Mccarty is an 37 y.o. male.   Chief Complaint  Patient presents with   Routine Post Op    EUA 01/23/23    HPI: Cody Mccarty 37 year old male following after chemical sphincterotomy with Botox.  He feels much better.  He did have an episode of bloody discharge from the rectum.  This was contained.  He feels better.  Significant improvement in the rectal pain.  No fevers no chills.  Very appreciative  Past Medical History:  Diagnosis Date   Anal fissure 01/2023   Cellulitis of left elbow 11/2021   Closed displaced fracture of fourth metacarpal bone 02/2016   right hand    Past Surgical History:  Procedure Laterality Date   CLOSED REDUCTION FINGER WITH PERCUTANEOUS PINNING Right 03/14/2016   Procedure: CLOSED REDUCTION FINGER WITH PERCUTANEOUS PINNING;  Surgeon: Kennedy Bucker, MD;  Location: [facility] ORS;  Service: Orthopedics;  Laterality: Right;   OPEN REDUCTION INTERNAL FIXATION (ORIF) METACARPAL Right 03/14/2016   Procedure: OPEN REDUCTION INTERNAL FIXATION (ORIF) METACARPAL;  Surgeon: Kennedy Bucker, MD;  Location: [facility] ORS;  Service: Orthopedics;  Laterality: Right;   SPHINCTEROTOMY N/A 01/23/2023   Procedure: SPHINCTEROTOMY, chemical with Botox;  Surgeon: Leafy Ro, MD;  Location: [facility] ORS;  Service: General;  Laterality: N/A;    History reviewed. No pertinent family history.  Social History:  reports that he has been smoking cigarettes. He has never used smokeless tobacco. He reports current alcohol use. He reports that he does not currently use drugs after having used the following drugs: Marijuana.  Allergies: No Known Allergies  Medications reviewed.    ROS Full ROS performed and is otherwise negative other than what is stated in HPI   BP 114/71 (BP Location: Left Arm, Patient Position: Sitting, Cuff Size: Large)   Pulse 64   Temp 98.7 F (37.1 C) (Oral)   Ht 5\' 10"  (1.778 m)   Wt 186 lb 6.4 oz (84.6 kg)   SpO2 96%   BMI 26.75  kg/m   Physical Exam CONSTITUTIONAL: NAD. EYES: Pupils are equal, round,  Sclera are non-icteric. EARS, NOSE, MOUTH AND THROAT: TThe oral mucosa is pink and moist. Hearing is intact to voice. GI: The abdomen is  soft, nontender, and nondistended. There are no palpable masses. There is no hepatosplenomegaly. There are normal bowel sounds in all quadrants.  Rectal : There is no evidence of perirectal abscess.  No evidence of perianal sepsis.  Significant improvement in fissure.   MUSCULOSKELETAL: Normal muscle strength and tone. No cyanosis or edema.   SKIN: Turgor is good and there are no pathologic skin lesions or ulcers. NEUROLOGIC: Motor and sensation is grossly normal. Cranial nerves are grossly intact. PSYCH:  Oriented to person, place and time. Affect is normal  Assessment/Plan: 37 year old male following after chemical sphincterotomy with Botox.  He feels much better.  No complications.  No need for redosing.  May follow-up on a as needed basis.  Advised to continue nifedipine cream, sitz bath's, fiber and appropriate hygiene.  Please note I spent 30 minutes in this encounter including person reviewing medical records, counseling the patient, placing orders and performing documentation  Sterling Big, MD [facility] General Surgeon

## 2023-05-06 ENCOUNTER — Other Ambulatory Visit: Payer: Self-pay

## 2023-05-06 ENCOUNTER — Emergency Department: Payer: BC Managed Care – PPO

## 2023-05-06 ENCOUNTER — Encounter: Payer: Self-pay | Admitting: Emergency Medicine

## 2023-05-06 ENCOUNTER — Emergency Department
Admission: EM | Admit: 2023-05-06 | Discharge: 2023-05-06 | Disposition: A | Discharge status: Home / Self Care | Payer: BC Managed Care – PPO | Attending: Emergency Medicine | Admitting: Emergency Medicine

## 2023-05-06 DIAGNOSIS — Y990 Civilian activity done for income or pay: Secondary | ICD-10-CM | POA: Insufficient documentation

## 2023-05-06 DIAGNOSIS — X503XXA Overexertion from repetitive movements, initial encounter: Secondary | ICD-10-CM | POA: Diagnosis not present

## 2023-05-06 DIAGNOSIS — M62838 Other muscle spasm: Secondary | ICD-10-CM | POA: Insufficient documentation

## 2023-05-06 DIAGNOSIS — S199XXA Unspecified injury of neck, initial encounter: Secondary | ICD-10-CM | POA: Diagnosis present

## 2023-05-06 DIAGNOSIS — S161XXA Strain of muscle, fascia and tendon at neck level, initial encounter: Secondary | ICD-10-CM | POA: Insufficient documentation

## 2023-05-06 MED ORDER — DIAZEPAM 5 MG PO TABS
5.0000 mg | ORAL_TABLET | Freq: Once | ORAL | Status: AC
  Administered 2023-05-06: 5 mg via ORAL
  Filled 2023-05-06: qty 1

## 2023-05-06 MED ORDER — NAPROXEN 500 MG PO TABS: 500.0000 mg | ORAL_TABLET | Freq: Two times a day (BID) | ORAL | 0 refills | Status: AC

## 2023-05-06 MED ORDER — METHOCARBAMOL 500 MG PO TABS: 500.0000 mg | ORAL_TABLET | Freq: Four times a day (QID) | ORAL | 0 refills | Status: DC

## 2023-05-06 MED ORDER — KETOROLAC TROMETHAMINE 60 MG/2ML IM SOLN
30.0000 mg | Freq: Once | INTRAMUSCULAR | Status: AC
  Administered 2023-05-06: 30 mg via INTRAMUSCULAR
  Filled 2023-05-06: qty 2

## 2023-05-06 NOTE — ED Triage Notes (Signed)
C/O right neck/upper back pain x 2 days.  States no known injury,  but lifts heavy things while at work.

## 2023-05-06 NOTE — Discharge Instructions (Addendum)
Please pick up the medication from the pharmacy and take as prescribed.  You can also use ice and heat to the area.  If symptoms have not gotten better within 2 weeks, you can call the orthopedic clinic number listed in this paperwork for ongoing outpatient management.

## 2023-05-06 NOTE — ED Provider Notes (Signed)
[facility] Provider Note    Event Date/Time   First MD Initiated Contact with Patient 05/06/23 1009     (approximate)   History   Neck Pain   HPI Cody Mccarty is a 37 y.o. male presenting today for right-sided neck and right shoulder pain.  Patient states frequently having to lift heavy boxes at work.  Thinks he may have strained the right side of his neck while lifting this 1 week ago.  Has had worsening pain in his neck as well as the muscles over his right shoulder.  Has not taken any medication for it.  Denies any direct trauma but states he does frequently put the boxes on that right shoulder.  Denies injury elsewhere.  No numbness or tingling to right arm.     Physical Exam   Triage Vital Signs: ED Triage Vitals  Encounter Vitals Group     BP 05/06/23 1007 134/70     Systolic BP Percentile --      Diastolic BP Percentile --      Pulse Rate 05/06/23 1007 88     Resp 05/06/23 1007 18     Temp 05/06/23 1007 98 F (36.7 C)     Temp src --      SpO2 05/06/23 1007 99 %     Weight 05/06/23 0934 185 lb (83.9 kg)     Height 05/06/23 0934 5\' 10"  (1.778 m)     Head Circumference --      Peak Flow --      Pain Score 05/06/23 0934 6     Pain Loc --      Pain Education --      Exclude from Growth Chart --     Most recent vital signs: Vitals:   05/06/23 1007  BP: 134/70  Pulse: 88  Resp: 18  Temp: 98 F (36.7 C)  SpO2: 99%   I have reviewed the vital signs. General:  Awake, alert, no acute distress. Head:  Normocephalic, Atraumatic. EENT:  PERRL, EOMI, Oral mucosa pink and moist, Neck is supple. Cardiovascular: Regular rate, 2+ distal pulses. Respiratory:  Normal respiratory effort, symmetrical expansion, no distress.   Extremities: Mild tenderness palpation along the right lower neck as well as right shoulder.  Does appear to be some swelling noted over the spot with slight tenderness to palpation.  Pain with passive range of motion of  right shoulder.  No tenderness palpation to C-spine. Neuro:  Alert and oriented.  Interacting appropriately.  No numbness or tingling sensations. Skin:  Warm, dry, no rash.   Psych: Appropriate affect.     ED Results / Procedures / Treatments   Labs (all labs ordered are listed, but only abnormal results are displayed) Labs Reviewed - No data to display   EKG    RADIOLOGY Independently interpreted x-ray with no acute pathology   PROCEDURES:  Critical Care performed: No  Procedures   MEDICATIONS ORDERED IN ED: Medications  ketorolac (TORADOL) injection 30 mg (30 mg Intramuscular Given 05/06/23 1050)  diazepam (VALIUM) tablet 5 mg (5 mg Oral Given 05/06/23 1050)     IMPRESSION / MDM / ASSESSMENT AND PLAN / ED COURSE  I reviewed the triage vital signs and the nursing notes.                              Differential diagnosis includes, but is not limited to, neck muscle strain,  trapezius strain, overuse injury  Patient's presentation is most consistent with acute complicated illness / injury requiring diagnostic workup.  Patient is a 37 year old male presenting today for spasms of the muscle of his right lateral neck and right upper back.  Noticeable tenseness to them with tenderness palpation.  No obvious deformity anywhere.  Symptoms do seem to be secondary to recurrent use and overuse injury.  Most consistent with a muscle strain.  X-ray of the right shoulder unremarkable.  Patient was given Toradol and Valium with significant proved in symptoms.  Will discharge home with Robaxin for ongoing treatment as well as NSAID use at home.  Told to follow-up with primary care provider or orthopedics should symptoms not improve within the next 7 to 10 days.  Clinical Course as of 05/06/23 1255  Tue May 06, 2023  1222 Reassessed patient.  Pain symptoms are improving.  Will discharge [DW]    Clinical Course User Index [DW] Janith Lima, MD     FINAL CLINICAL IMPRESSION(S)  / ED DIAGNOSES   Final diagnoses:  Strain of neck muscle, initial encounter  Muscle spasms of neck     Rx / DC Orders   ED Discharge Orders          Ordered    methocarbamol (ROBAXIN) 500 MG tablet  4 times daily        05/06/23 1224    naproxen (NAPROSYN) 500 MG tablet  2 times daily with meals        05/06/23 1224    Ambulatory Referral to Primary Care (Establish Care)        05/06/23 1224             Note:  This document was prepared using Dragon voice recognition software and may include unintentional dictation errors.   Janith Lima, MD 05/06/23 1256

## 2023-05-06 NOTE — ED Notes (Signed)
See triage note  Presents with pain and swelling to right side of neck  States it started last week  Unsure of injury but states he works 2 jobs  He has a large area noted to lateral neck and posterior shoulder

## 2023-11-16 ENCOUNTER — Ambulatory Visit
Admission: EM | Admit: 2023-11-16 | Discharge: 2023-11-16 | Disposition: A | Discharge status: Home / Self Care | Attending: Emergency Medicine | Admitting: Emergency Medicine

## 2023-11-16 DIAGNOSIS — S29019A Strain of muscle and tendon of unspecified wall of thorax, initial encounter: Secondary | ICD-10-CM

## 2023-11-16 DIAGNOSIS — S46001A Unspecified injury of muscle(s) and tendon(s) of the rotator cuff of right shoulder, initial encounter: Secondary | ICD-10-CM

## 2023-11-16 MED ORDER — IBUPROFEN 600 MG PO TABS: 600.0000 mg | ORAL_TABLET | Freq: Four times a day (QID) | ORAL | 0 refills | Status: AC | PRN

## 2023-11-16 MED ORDER — BACLOFEN 10 MG PO TABS: 10.0000 mg | ORAL_TABLET | Freq: Three times a day (TID) | ORAL | 0 refills | Status: AC

## 2023-11-16 NOTE — ED Triage Notes (Signed)
 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 Pain in shoulders upper back and can't lit up arm. Right side

## 2023-11-16 NOTE — Discharge Instructions (Signed)
 Take the ibuprofen  600 mg every 6 hours with food to help with pain and inflammation.  Take the baclofen 10 mg every 8 hours to help with muscle pain, tension, and spasm in your upper back and shoulders.  Wear the sling on your right arm at all times except when bathing or dressing.  Apply ice to your shoulder for 20 minutes at a time, 4-5 times a day, to help decrease inflammation and aid in pain relief.  Make sure there is a cloth between the ice and your shoulder so as to not cause skin damage.  You need to contact your Worker's Comp. carrier as you will need a referral to orthopedics for further evaluation and imaging to determine whether or not you have an injury to your rotator cuff and to what degree.

## 2023-11-16 NOTE — ED Provider Notes (Addendum)
 [facility]    CSN: [id number] Arrival date & time: 11/16/23  1014      History   Chief Complaint Chief Complaint  Patient presents with   Back Pain   Shoulder Pain    HPI Cody Mccarty is a 38 y.o. male.   HPI  38 year old male with past medical history significant for anal fissures presents for evaluation of pain in both of his shoulders and upper back.  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  He reports that when he extended his arms to the right he felt significant pain in his right shoulder and developed pain in both the shoulders and upper back.  He denies any numbness or tingling in his fingers.  He has used ice and Biofreeze without any improvement of symptoms.  Range of motion of his right arm is greatly diminished and difficult secondary to pain.  Past Medical History:  Diagnosis Date   Anal fissure 01/2023   Cellulitis of left elbow 11/2021   Closed displaced fracture of fourth metacarpal bone 02/2016   right hand    Patient Active Problem List   Diagnosis Date Noted   Anal fissure 01/23/2023    Past Surgical History:  Procedure Laterality Date   CLOSED REDUCTION FINGER WITH PERCUTANEOUS PINNING Right 03/14/2016   Procedure: CLOSED REDUCTION FINGER WITH PERCUTANEOUS PINNING;  Surgeon: Molli Angelucci, MD;  Location: [facility] OR[facility];  Service: Orthopedics;  Laterality: Right;   OPEN REDUCTION INTERNAL FIXATION (ORIF) METACARPAL Right 03/14/2016   Procedure: OPEN REDUCTION INTERNAL FIXATION (ORIF) METACARPAL;  Surgeon: Molli Angelucci, MD;  Location: [facility] OR[facility];  Service: Orthopedics;  Laterality: Right;   SPHINCTEROTOMY N/A 01/23/2023   Procedure: SPHINCTEROTOMY, chemical with Botox ;  Surgeon: Alben Alma, MD;  Location: [facility] OR[facility];  Service: General;  Laterality: N/A;       Home Medications    Prior to Admission medications    Medication Sig Start Date End Date Taking? Authorizing Provider  baclofen (LIORESAL) 10 MG tablet Take 1 tablet (10 mg total) by mouth 3 (three) times daily. 11/16/23  Yes Kent Pear, NP  ibuprofen  (ADVIL ) 600 MG tablet Take 1 tablet (600 mg total) by mouth every 6 (six) hours as needed. 11/16/23  Yes Kent Pear, NP    Family History History reviewed. No pertinent family history.  Social History Social History   Tobacco Use   Smoking status: Every Day    Current packs/day: 0.25    Types: Cigarettes   Smokeless tobacco: Never  Vaping Use   Vaping status: Never Used  Substance Use Topics   Alcohol use: Yes    Comment: occassional   Drug use: Not Currently    Types: Marijuana     Allergies   Patient has no known allergies.   Review of Systems Review of Systems  Musculoskeletal:  Positive for arthralgias. Negative for joint swelling.  Skin:  Negative for color change.  Neurological:  Positive for weakness. Negative for numbness.     Physical Exam Triage Vital Signs ED Triage Vitals  Encounter Vitals Group     BP      Systolic BP Percentile      Diastolic BP Percentile      Pulse      Resp      Temp      Temp src  SpO2      Weight      Height      Head Circumference      Peak Flow      Pain Score      Pain Loc      Pain Education      Exclude from Growth Chart    No data found.  Updated Vital Signs BP 117/64 (BP Location: Left Arm)   Pulse (!) 55   Temp 98.3 F (36.8 C) (Oral)   Resp 19   SpO2 100%   Visual Acuity Right Eye Distance:   Left Eye Distance:   Bilateral Distance:    Right Eye Near:   Left Eye Near:    Bilateral Near:     Physical Exam Vitals and nursing note reviewed.  Constitutional:      Appearance: Normal appearance. He is not ill-appearing.  HENT:     Head: Normocephalic and atraumatic.  Musculoskeletal:        General: Tenderness and signs of injury present. No swelling.  Skin:    General: Skin is warm and dry.      Capillary Refill: Capillary refill takes less than 2 seconds.     Findings: No bruising or erythema.  Neurological:     General: No focal deficit present.     Mental Status: He is alert and oriented to person, place, and time.      UC Treatments / Results  Labs (all labs ordered are listed, but only abnormal results are displayed) Labs Reviewed - No data to display  EKG   Radiology No results found.  Procedures Procedures (including critical care time)  Medications Ordered in UC Medications - No data to display  Initial Impression / Assessment and Plan / UC Course  I have reviewed the triage vital signs and the nursing notes.  Pertinent labs & imaging results that were available during my care of the patient were reviewed by me and considered in my medical decision making (see chart for details).   Patient is a pleasant, nontoxic-appearing 38 year old male presenting for evaluation of musculoskeletal complaints as outlined in HPI above.  He has no significant pain is in his right shoulder.  On exam both of his shoulders are in normal axial alignment.  His bilateral grips are 5/5 and his upper extremity strength is 5/5.  His pain does increase with resisted extension of his right arm but does not increase with resisted flexion of the right arm.  He is only able to abduct his arm approximately 15 degrees secondary to pain.  There is significant tenderness with palpation of the anterior shoulder girdle on the right as well as significant tension in bilateral upper trapezius muscles.  He has a positive empty soup can test on the right-hand side which is suggestive of rotator cuff injury.  Patient was experiencing pain throughout the entire range of motion of his right shoulder.  I will place the patient in a sling and restrict him to left arm duty only at work.  I will place him on ibuprofen  600 mg every 6 hours along with baclofen 10 mg every 8 hours.  Frequent icing to the shoulder  to help decrease inflammation.  I will have him contact his Worker's Comp. carrier to determine which orthopedist he needs to follow-up with for further imaging and assessment.   Final Clinical Impressions(s) / UC Diagnoses   Final diagnoses:  Injury of right rotator cuff, initial encounter  Thoracic myofascial strain, initial  encounter     Discharge Instructions      Take the ibuprofen  600 mg every 6 hours with food to help with pain and inflammation.  Take the baclofen 10 mg every 8 hours to help with muscle pain, tension, and spasm in your upper back and shoulders.  Wear the sling on your right arm at all times except when bathing or dressing.  Apply ice to your shoulder for 20 minutes at a time, 4-5 times a day, to help decrease inflammation and aid in pain relief.  Make sure there is a cloth between the ice and your shoulder so as to not cause skin damage.  You need to contact your Worker's Comp. carrier as you will need a referral to orthopedics for further evaluation and imaging to determine whether or not you have an injury to your rotator cuff and to what degree.   ED Prescriptions     Medication Sig Dispense Auth. Provider   ibuprofen  (ADVIL ) 600 MG tablet Take 1 tablet (600 mg total) by mouth every 6 (six) hours as needed. 30 tablet Kent Pear, NP   baclofen (LIORESAL) 10 MG tablet Take 1 tablet (10 mg total) by mouth 3 (three) times daily. 30 each Kent Pear, NP      PDMP not reviewed this encounter.   Kent Pear, NP 11/16/23 1053    Kent Pear, NP 11/16/23 1055
